# Patient Record
Sex: Female | Born: 1940 | Race: White | Hispanic: No | Smoking: Never smoker
Health system: Southern US, Community
[De-identification: ages and names within clinical notes are randomized; demographics above are authoritative.]

## PROBLEM LIST (undated history)

## (undated) DIAGNOSIS — E119 Type 2 diabetes mellitus without complications: Secondary | ICD-10-CM

## (undated) DIAGNOSIS — E039 Hypothyroidism, unspecified: Secondary | ICD-10-CM

## (undated) DIAGNOSIS — I4891 Unspecified atrial fibrillation: Secondary | ICD-10-CM

## (undated) DIAGNOSIS — I2510 Atherosclerotic heart disease of native coronary artery without angina pectoris: Secondary | ICD-10-CM

## (undated) DIAGNOSIS — I5020 Unspecified systolic (congestive) heart failure: Secondary | ICD-10-CM

## (undated) DIAGNOSIS — I10 Essential (primary) hypertension: Secondary | ICD-10-CM

## (undated) DIAGNOSIS — E785 Hyperlipidemia, unspecified: Secondary | ICD-10-CM

## (undated) DIAGNOSIS — I480 Paroxysmal atrial fibrillation: Secondary | ICD-10-CM

## (undated) HISTORY — DX: Unspecified systolic (congestive) heart failure: I50.20

## (undated) HISTORY — PX: COLONOSCOPY: SHX174

## (undated) HISTORY — PX: TONSILLECTOMY: SUR1361

---

## 1984-04-06 HISTORY — PX: ABDOMINAL HYSTERECTOMY: SHX81

## 2004-09-23 ENCOUNTER — Ambulatory Visit: Payer: Self-pay | Admitting: Internal Medicine

## 2005-05-20 ENCOUNTER — Ambulatory Visit: Payer: Self-pay | Admitting: Urology

## 2005-11-05 ENCOUNTER — Ambulatory Visit: Payer: Self-pay | Admitting: Internal Medicine

## 2006-08-06 ENCOUNTER — Emergency Department: Payer: Self-pay | Admitting: Internal Medicine

## 2006-08-06 ENCOUNTER — Other Ambulatory Visit: Payer: Self-pay

## 2006-11-26 ENCOUNTER — Ambulatory Visit: Payer: Self-pay | Admitting: Internal Medicine

## 2007-02-14 ENCOUNTER — Ambulatory Visit: Payer: Self-pay | Admitting: Physician Assistant

## 2007-02-17 ENCOUNTER — Ambulatory Visit: Payer: Self-pay | Admitting: Gastroenterology

## 2007-04-07 HISTORY — PX: HAMMER TOE SURGERY: SHX385

## 2008-01-05 ENCOUNTER — Ambulatory Visit: Payer: Self-pay | Admitting: Internal Medicine

## 2008-12-27 ENCOUNTER — Encounter: Admission: RE | Admit: 2008-12-27 | Discharge: 2008-12-27 | Payer: Self-pay | Admitting: Internal Medicine

## 2008-12-27 IMAGING — US US EXTREM LOW VENOUS*R*
1 series · 17 of 24 positions shown · non-contrast
Comparison: none

REASON FOR EXAM: Rt Leg Edema  Call Report [id number] Ext [phone]
COMMENTS:

[Series 1: us extrem low venous*right* · 17 of 33 slices shown]
[im 1/33]
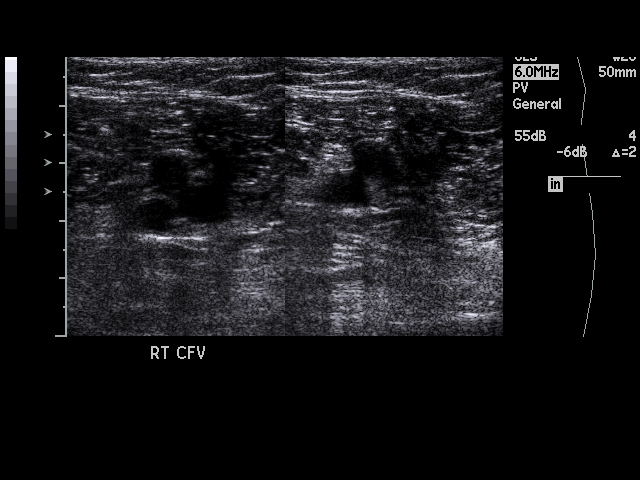
[im 3/33]
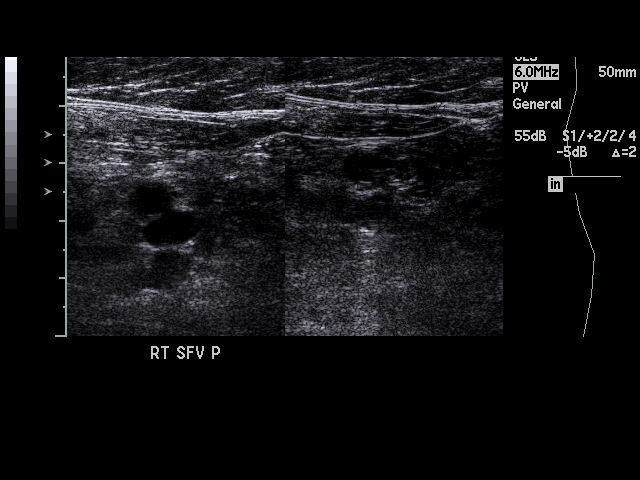
[im 5/33]
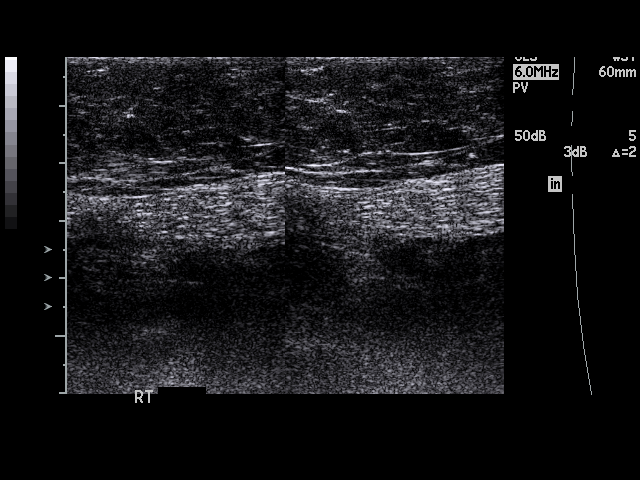
[im 6/33]
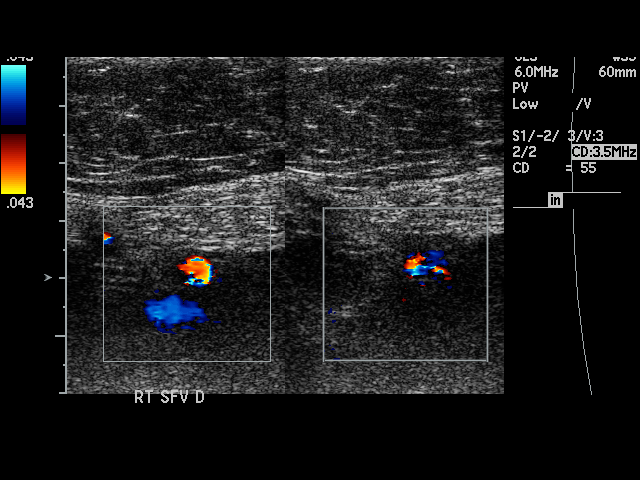
[im 9/33]
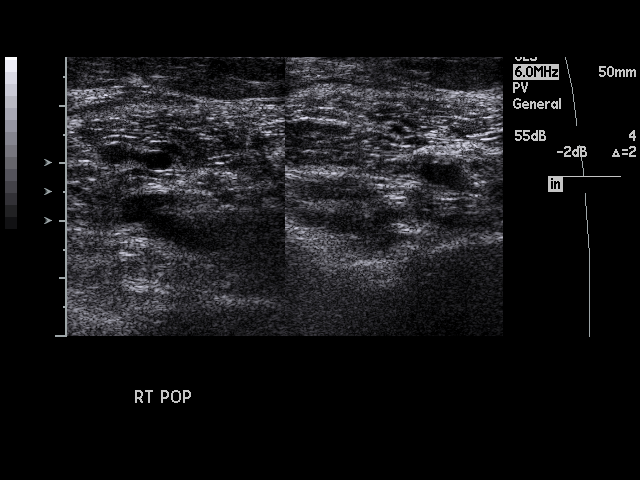
[im 10/33]
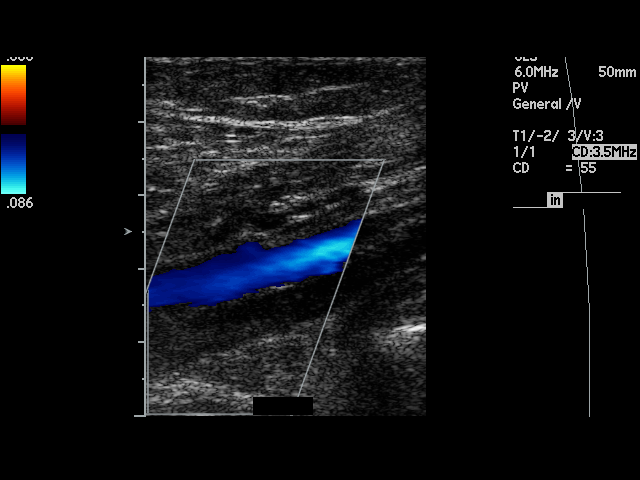
[im 13/33]
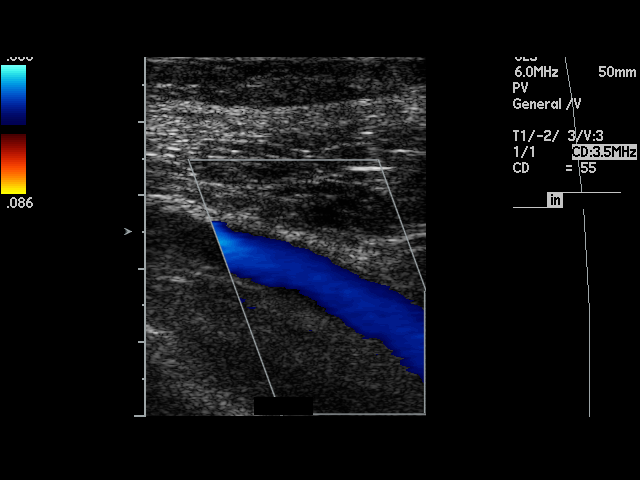
[im 14/33]
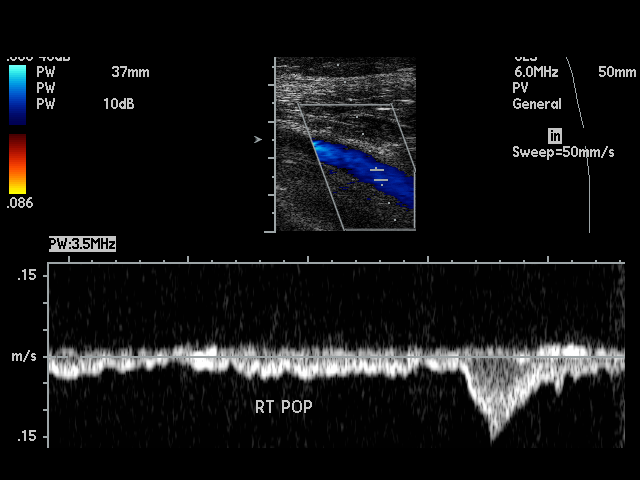
[im 17/33]
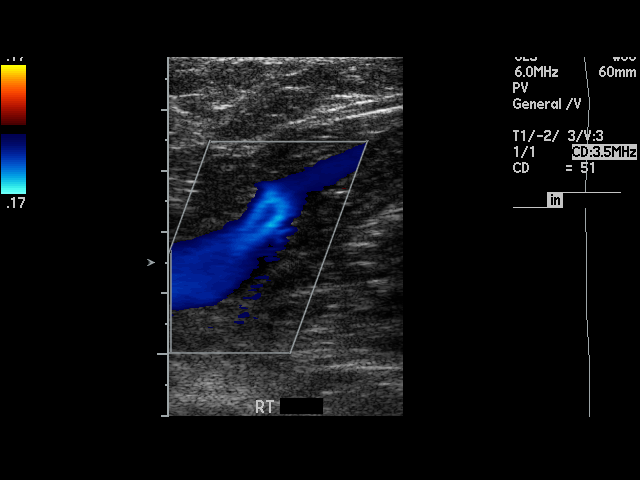
[im 19/33]
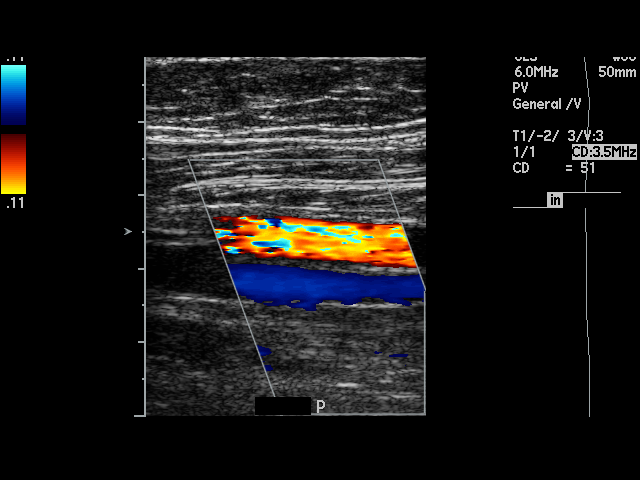
[im 20/33]
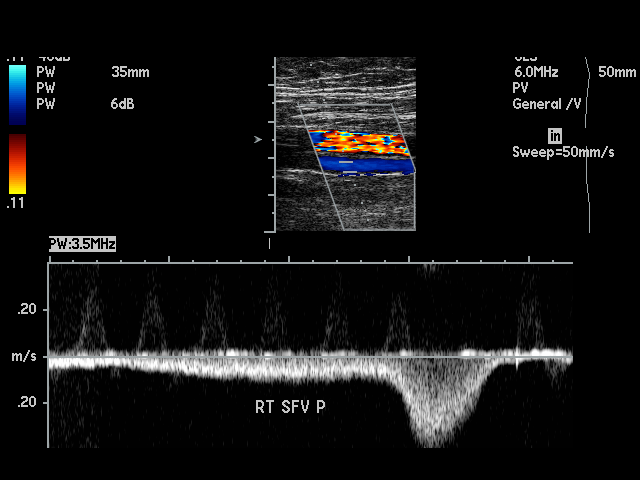
[im 23/33]
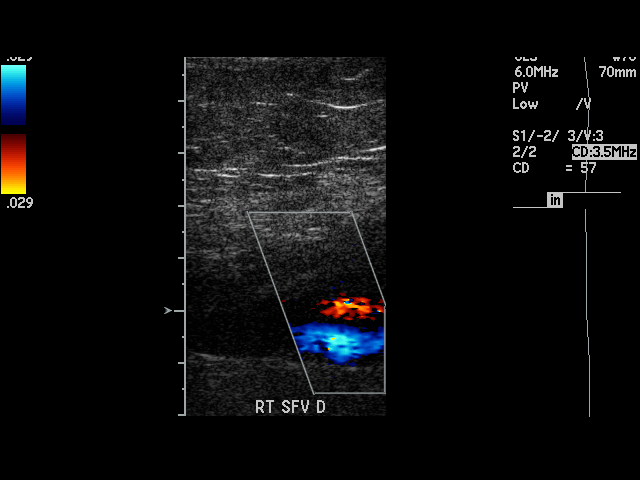
[im 24/33]
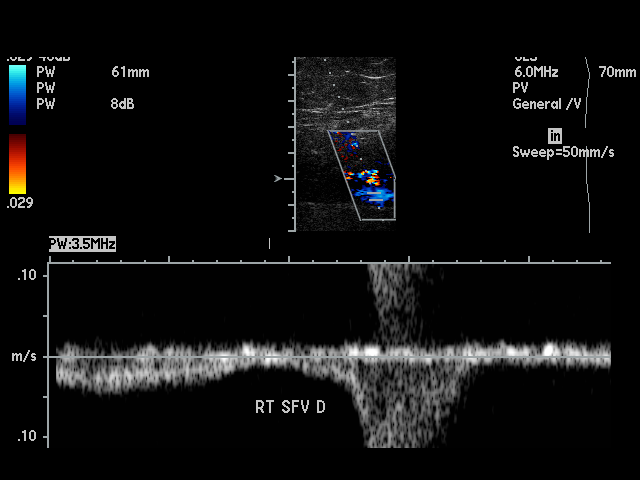
[im 27/33]
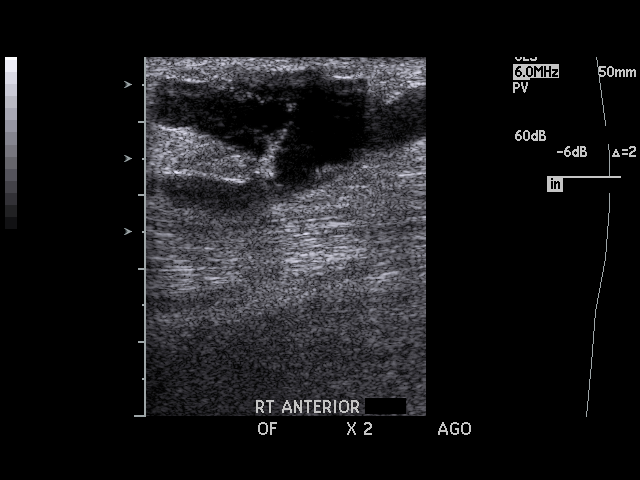
[im 28/33]
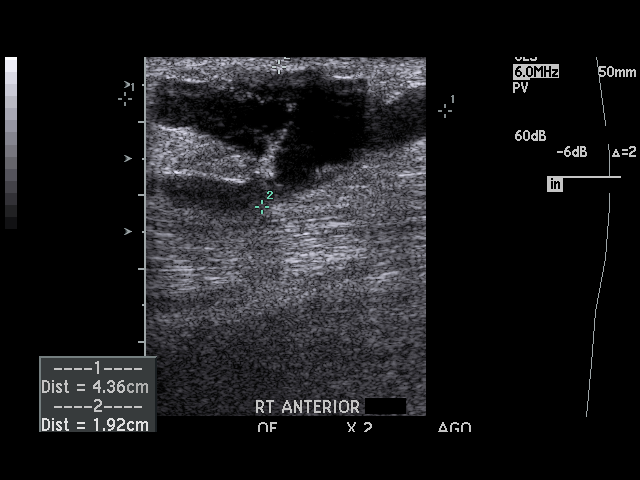
[im 30/33]
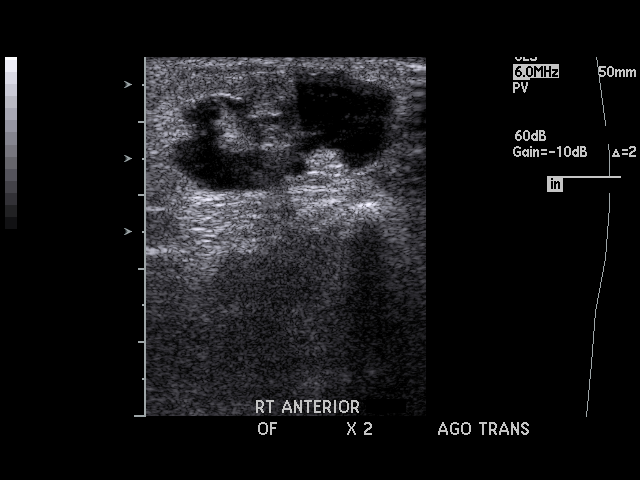
[im 33/33]
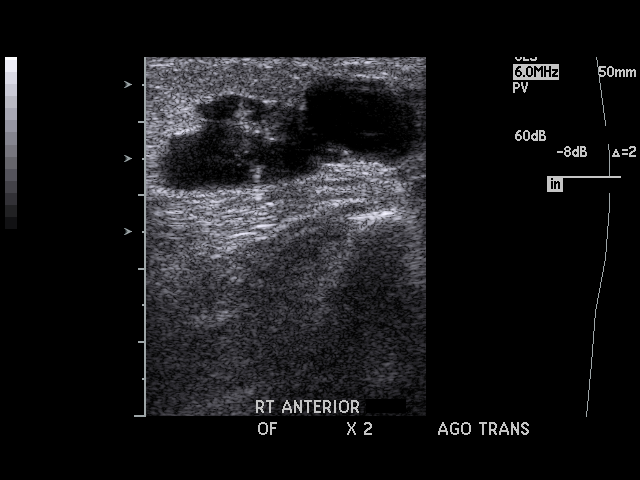

[17 of 24 positions shown; findings below may reference images not displayed]

PROCEDURE:     US  - US DOPPLER LOW EXTR RIGHT  - February 14, 2007  [DATE]

RESULT:     Duplex Doppler interrogation of the deep venous system of the
RIGHT leg is performed. The study demonstrates normal compressibility of the
deep venous system from the inguinal through the popliteal region. The veins
are fully compressible with a normal color and SPECRAL Doppler appearance.
There is increased flow with distal augmentation.

There is an irregularly marginated area of predominantly decreased
echogenicity in the anterior mid lower leg. This could represent a hematoma
given the history of trauma. An infectious process with an abscess could not
be completely excluded but given the irregular margins is felt to be less
likely. There may be septation present.
IMPRESSION: 1. No DVT.
2. Fluid collection anteriorly in the lower leg on the RIGHT which could
represent hematoma or developing abscess. Followup is recommended.

## 2009-01-08 ENCOUNTER — Ambulatory Visit: Payer: Self-pay | Admitting: Internal Medicine

## 2010-01-10 ENCOUNTER — Ambulatory Visit: Payer: Self-pay | Admitting: Internal Medicine

## 2011-01-14 ENCOUNTER — Ambulatory Visit: Payer: Self-pay | Admitting: Internal Medicine

## 2019-08-18 HISTORY — PX: CORONARY ANGIOPLASTY WITH STENT PLACEMENT: SHX49

## 2020-06-24 ENCOUNTER — Other Ambulatory Visit: Payer: Self-pay | Admitting: Infectious Diseases

## 2020-06-24 DIAGNOSIS — R7989 Other specified abnormal findings of blood chemistry: Secondary | ICD-10-CM

## 2020-07-23 ENCOUNTER — Other Ambulatory Visit: Payer: Self-pay

## 2020-07-23 ENCOUNTER — Ambulatory Visit
Admission: RE | Admit: 2020-07-23 | Discharge: 2020-07-23 | Disposition: A | Discharge status: Home / Self Care | Payer: Medicare Other | Source: Ambulatory Visit | Attending: Infectious Diseases | Admitting: Infectious Diseases

## 2020-07-23 DIAGNOSIS — R7989 Other specified abnormal findings of blood chemistry: Secondary | ICD-10-CM | POA: Insufficient documentation

## 2020-08-07 ENCOUNTER — Other Ambulatory Visit: Payer: Self-pay | Admitting: General Surgery

## 2020-08-07 DIAGNOSIS — K8020 Calculus of gallbladder without cholecystitis without obstruction: Secondary | ICD-10-CM

## 2020-08-07 DIAGNOSIS — K8021 Calculus of gallbladder without cholecystitis with obstruction: Secondary | ICD-10-CM

## 2020-08-07 DIAGNOSIS — R748 Abnormal levels of other serum enzymes: Secondary | ICD-10-CM

## 2020-08-24 ENCOUNTER — Ambulatory Visit
Admission: RE | Admit: 2020-08-24 | Discharge: 2020-08-24 | Disposition: A | Discharge status: Home / Self Care | Payer: Medicare Other | Source: Ambulatory Visit | Attending: General Surgery | Admitting: General Surgery

## 2020-08-24 DIAGNOSIS — K8020 Calculus of gallbladder without cholecystitis without obstruction: Secondary | ICD-10-CM

## 2020-08-24 DIAGNOSIS — R748 Abnormal levels of other serum enzymes: Secondary | ICD-10-CM

## 2020-08-24 DIAGNOSIS — K8021 Calculus of gallbladder without cholecystitis with obstruction: Secondary | ICD-10-CM

## 2020-08-24 MED ORDER — GADOBENATE DIMEGLUMINE 529 MG/ML IV SOLN
20.0000 mL | Freq: Once | INTRAVENOUS | Status: AC | PRN
  Administered 2020-08-24: 20 mL via INTRAVENOUS

## 2020-10-24 ENCOUNTER — Ambulatory Visit: Payer: Self-pay | Admitting: General Surgery

## 2020-10-24 NOTE — H&P (View-Only) (Signed)
PATIENT PROFILE: Sue Andrews is a 79 y.o. female who presents for evaluation of cholelithiasis.  PCP:  Fitzgerald, David Patrick, MD  HISTORY OF PRESENT ILLNESS: Sue Andrews reports feeling well.  She denies any significant abdominal pain.  There is no pain radiation.  There is no alleviating or aggravating factors.  Patient reported that she has been doing well since she was evaluated last time.  She has been seen before due to ultrasound finding of 3.5 cm gallbladder stone.  She reports having mild elevated alkaline phosphatase.  This led to MRCP.  MRCP confirmed a 3.5 cm gallbladder stone.  Denies any jaundice.  Denies any fever or chills.  Patient tolerating diet.  Patient denies any chest pain or shortness of breath.  She was evaluated recently by cardiology.  Cardiology cleared patient for surgery.   PROBLEM LIST: Problem List  Never Reviewed          Noted   S/P drug eluting coronary stent placement 06/10/2020   Overview    3.0 x 16 mm Synergy, 08/18/2019       Coronary artery disease involving native heart without angina pectoris 05/22/2020   Type 2 diabetes mellitus without complication, with long-term current use of insulin (CMS-HCC) 05/22/2020   HTN (hypertension), benign 05/22/2020   Hyperlipidemia 05/22/2020   Hypothyroidism 05/22/2020   History of atrial fibrillation 05/22/2020       GENERAL REVIEW OF SYSTEMS:   General ROS: negative for - chills, fatigue, fever, weight gain or weight loss Allergy and Immunology ROS: negative for - hives  Hematological and Lymphatic ROS: negative for - bleeding problems or bruising, negative for palpable nodes Endocrine ROS: negative for - heat or cold intolerance, hair changes Respiratory ROS: negative for - cough, shortness of breath or wheezing Cardiovascular ROS: no chest pain or palpitations GI ROS: negative for nausea, vomiting, abdominal pain, diarrhea, constipation Musculoskeletal ROS: negative for - joint swelling or  muscle pain Neurological ROS: negative for - confusion, syncope Dermatological ROS: negative for pruritus and rash Psychiatric: negative for anxiety, depression, difficulty sleeping and memory loss  MEDICATIONS: Current Outpatient Medications  Medication Sig Dispense Refill   apixaban (ELIQUIS) 5 mg tablet Take 1 tablet (5 mg total) by mouth 2 (two) times daily 60 tablet 11   aspirin 81 MG EC tablet Take 1 tablet (81 mg total) by mouth once daily 30 tablet 11   cholecalciferol (VITAMIN D3) 1000 unit tablet Take by mouth     cyanocobalamin (VITAMIN B12) 1000 MCG tablet Take 1,000 mcg by mouth once daily     FUROsemide (LASIX) 20 MG tablet Take 1 tablet (20 mg total) by mouth once daily 90 tablet 2   HUMALOG MIX 75-25 KWIKPEN 100 unit/mL (75-25) pen injector Inject 40 Units subcutaneously 2 (two) times daily 45 mL 3   levothyroxine (SYNTHROID) 25 MCG tablet Take 1 tablet (25 mcg total) by mouth once daily Take on an empty stomach with a glass of water at least 30-60 minutes before breakfast. 90 tablet 2   losartan (COZAAR) 25 MG tablet Take 1 tablet (25 mg total) by mouth once daily 90 tablet 3   metoprolol tartrate (LOPRESSOR) 50 MG tablet Take 1 tablet (50 mg total) by mouth 2 (two) times daily 60 tablet 11   montelukast (SINGULAIR) 10 mg tablet Take 1 tablet (10 mg total) by mouth nightly 90 tablet 3   multivit-min/iron/folic/lutein (CENTRUM SILVER WOMEN ORAL) Take by mouth     atorvastatin (LIPITOR) 40 MG tablet Take 1   tablet (40 mg total) by mouth once daily (Patient not taking: No sig reported) 90 tablet 3   No current facility-administered medications for this visit.    ALLERGIES: Patient has no known allergies.  PAST MEDICAL HISTORY: Past Medical History:  Diagnosis Date   Diabetes mellitus without complication (CMS-HCC)    Hyperlipidemia    Hypertension    Thyroid disease     PAST SURGICAL HISTORY: Past Surgical History:  Procedure Laterality Date   Cardiac Stent  Placement     HYSTERECTOMY       FAMILY HISTORY: Family History  Problem Relation Age of Onset   Cancer Mother    Diabetes Father      SOCIAL HISTORY: Social History   Socioeconomic History   Marital status: Widowed  Tobacco Use   Smoking status: Never Smoker   Smokeless tobacco: Never Used  Vaping Use   Vaping Use: Never used  Substance and Sexual Activity   Alcohol use: Not Currently    PHYSICAL EXAM: Vitals:   10/24/20 1130  BP: 126/79  Pulse: 79   Body mass index is 37.72 kg/m. Weight: 96.6 kg (212 lb 15.4 oz)   GENERAL: Alert, active, oriented x3  HEENT: Pupils equal reactive to light. Extraocular movements are intact. Sclera clear. Palpebral conjunctiva normal red color.Pharynx clear.  NECK: Supple with no palpable mass and no adenopathy.  LUNGS: Sound clear with no rales rhonchi or wheezes.  HEART: Regular rhythm S1 and S2 without murmur.  ABDOMEN: Soft and depressible, nontender with no palpable mass, no hepatomegaly.   EXTREMITIES: Well-developed well-nourished symmetrical with no dependent edema.  NEUROLOGICAL: Awake alert oriented, facial expression symmetrical, moving all extremities.  REVIEW OF DATA: I have reviewed the following data today: Office Visit on 09/30/2020  Component Date Value   Vent Rate (bpm) 09/30/2020 113    QRS Interval (msec) 09/30/2020 96    QT Interval (msec) 09/30/2020 326    QTc (msec) 09/30/2020 447   Ancillary Orders on 09/11/2020  Component Date Value   Hemoglobin A1C 09/11/2020 6.5 (!)   Average Blood Glucose (C* 09/11/2020 140    Glucose 09/11/2020 182 (!)   Sodium 09/11/2020 139    Potassium 09/11/2020 4.7    Chloride 09/11/2020 104    Carbon Dioxide (CO2) 09/11/2020 30.7    Urea Nitrogen (BUN) 09/11/2020 20    Creatinine 09/11/2020 0.7    Glomerular Filtration Ra* 09/11/2020 81    Calcium 09/11/2020 9.4    AST  09/11/2020 31    ALT  09/11/2020 29    Alk Phos (alkaline Phosp* 09/11/2020 111 (!)    Albumin 09/11/2020 3.8    Bilirubin, Total 09/11/2020 1.0    Protein, Total 09/11/2020 6.4    A/G Ratio 09/11/2020 1.5    Bilirubin, Conjugated 09/11/2020 0.22 (!)   Cholesterol, Total 09/11/2020 189    Triglyceride 09/11/2020 100    HDL (High Density Lipopr* 09/11/2020 63.6    LDL Calculated 09/11/2020 105    VLDL Cholesterol 09/11/2020 20    Cholesterol/HDL Ratio 09/11/2020 3.0   Ancillary Orders on 07/30/2020  Component Date Value   Protein, Total 07/30/2020 6.0 (!)   Albumin 07/30/2020 3.7    Bilirubin, Total 07/30/2020 1.0    Bilirubin, Conjugated 07/30/2020 0.26 (!)   Alk Phos (alkaline Phosp* 07/30/2020 170 (!)   AST  07/30/2020 46 (!)   ALT  07/30/2020 54 (!)     ASSESSMENT: Sue Andrews is a 79 y.o. female presenting for consultation for cholelithiasis.      Patient was oriented about the diagnosis of cholelithiasis. Also oriented about what is the gallbladder, its anatomy and function and the implications of having stones / gallbladder low ejection fraction. The patient was oriented about the treatment alternatives (observation vs cholecystectomy). Patient was oriented that a low percentage of patient will continue to have similar pain symptoms even after the gallbladder is removed. Surgical technique (open vs laparoscopic) was discussed. It was also discussed the goals of the surgery (decrease the pain episodes and avoid the risk of cholecystitis) and the risk of surgery including: bleeding, infection, common bile duct injury, stone retention, injury to other organs such as bowel, liver, stomach, other complications such as hernia, bowel obstruction among others. Also discussed with patient about anesthesia and its complications such as: reaction to medications, pneumonia, heart complications, death, among others.   Patient oriented that the indication for surgery is due to the size of the stone more than 3 cm with increased risk of gallbladder cancer.  Patient understand and she  agreed to proceed.  Cholelithiasis without cholecystitis [K80.20]  PLAN: 1.  Robotic assisted laparoscopic cholecystectomy (47562) 2.  Cardiology clearance (Dr. Paraschos) 3. Will need to hold aspirin for 48 hours before surgery 4.  Do not take aspirin 5 days before the procedure 5.  Contact us if has any question or concern.   Patient verbalized understanding, all questions were answered, and were agreeable with the plan outlined above.     Aleisha Paone Cintron-Diaz, MD  Electronically signed by Buford Gayler Cintron-Diaz, MD  

## 2020-10-24 NOTE — H&P (Signed)
PATIENT PROFILE: Sue Andrews is a 80 y.o. female who presents for evaluation of cholelithiasis.  PCP:  Angelena Form, MD  HISTORY OF PRESENT ILLNESS: Ms. Nonaka reports feeling well.  She denies any significant abdominal pain.  There is no pain radiation.  There is no alleviating or aggravating factors.  Patient reported that she has been doing well since she was evaluated last time.  She has been seen before due to ultrasound finding of 3.5 cm gallbladder stone.  She reports having mild elevated alkaline phosphatase.  This led to MRCP.  MRCP confirmed a 3.5 cm gallbladder stone.  Denies any jaundice.  Denies any fever or chills.  Patient tolerating diet.  Patient denies any chest pain or shortness of breath.  She was evaluated recently by cardiology.  Cardiology cleared patient for surgery.   PROBLEM LIST: Problem List  Never Reviewed          Noted   S/P drug eluting coronary stent placement 06/10/2020   Overview    3.0 x 16 mm Synergy, 08/18/2019       Coronary artery disease involving native heart without angina pectoris 05/22/2020   Type 2 diabetes mellitus without complication, with long-term current use of insulin (CMS-HCC) 05/22/2020   HTN (hypertension), benign 05/22/2020   Hyperlipidemia 05/22/2020   Hypothyroidism 05/22/2020   History of atrial fibrillation 05/22/2020       GENERAL REVIEW OF SYSTEMS:   General ROS: negative for - chills, fatigue, fever, weight gain or weight loss Allergy and Immunology ROS: negative for - hives  Hematological and Lymphatic ROS: negative for - bleeding problems or bruising, negative for palpable nodes Endocrine ROS: negative for - heat or cold intolerance, hair changes Respiratory ROS: negative for - cough, shortness of breath or wheezing Cardiovascular ROS: no chest pain or palpitations GI ROS: negative for nausea, vomiting, abdominal pain, diarrhea, constipation Musculoskeletal ROS: negative for - joint swelling or  muscle pain Neurological ROS: negative for - confusion, syncope Dermatological ROS: negative for pruritus and rash Psychiatric: negative for anxiety, depression, difficulty sleeping and memory loss  MEDICATIONS: Current Outpatient Medications  Medication Sig Dispense Refill   apixaban (ELIQUIS) 5 mg tablet Take 1 tablet (5 mg total) by mouth 2 (two) times daily 60 tablet 11   aspirin 81 MG EC tablet Take 1 tablet (81 mg total) by mouth once daily 30 tablet 11   cholecalciferol (VITAMIN D3) 1000 unit tablet Take by mouth     cyanocobalamin (VITAMIN B12) 1000 MCG tablet Take 1,000 mcg by mouth once daily     FUROsemide (LASIX) 20 MG tablet Take 1 tablet (20 mg total) by mouth once daily 90 tablet 2   HUMALOG MIX 75-25 KWIKPEN 100 unit/mL (75-25) pen injector Inject 40 Units subcutaneously 2 (two) times daily 45 mL 3   levothyroxine (SYNTHROID) 25 MCG tablet Take 1 tablet (25 mcg total) by mouth once daily Take on an empty stomach with a glass of water at least 30-60 minutes before breakfast. 90 tablet 2   losartan (COZAAR) 25 MG tablet Take 1 tablet (25 mg total) by mouth once daily 90 tablet 3   metoprolol tartrate (LOPRESSOR) 50 MG tablet Take 1 tablet (50 mg total) by mouth 2 (two) times daily 60 tablet 11   montelukast (SINGULAIR) 10 mg tablet Take 1 tablet (10 mg total) by mouth nightly 90 tablet 3   multivit-min/iron/folic/lutein (CENTRUM SILVER WOMEN ORAL) Take by mouth     atorvastatin (LIPITOR) 40 MG tablet Take 1  tablet (40 mg total) by mouth once daily (Patient not taking: No sig reported) 90 tablet 3   No current facility-administered medications for this visit.    ALLERGIES: Patient has no known allergies.  PAST MEDICAL HISTORY: Past Medical History:  Diagnosis Date   Diabetes mellitus without complication (CMS-HCC)    Hyperlipidemia    Hypertension    Thyroid disease     PAST SURGICAL HISTORY: Past Surgical History:  Procedure Laterality Date   Cardiac Stent  Placement     HYSTERECTOMY       FAMILY HISTORY: Family History  Problem Relation Age of Onset   Cancer Mother    Diabetes Father      SOCIAL HISTORY: Social History   Socioeconomic History   Marital status: Widowed  Tobacco Use   Smoking status: Never Smoker   Smokeless tobacco: Never Used  Vaping Use   Vaping Use: Never used  Substance and Sexual Activity   Alcohol use: Not Currently    PHYSICAL EXAM: Vitals:   10/24/20 1130  BP: 126/79  Pulse: 79   Body mass index is 37.72 kg/m. Weight: 96.6 kg (212 lb 15.4 oz)   GENERAL: Alert, active, oriented x3  HEENT: Pupils equal reactive to light. Extraocular movements are intact. Sclera clear. Palpebral conjunctiva normal red color.Pharynx clear.  NECK: Supple with no palpable mass and no adenopathy.  LUNGS: Sound clear with no rales rhonchi or wheezes.  HEART: Regular rhythm S1 and S2 without murmur.  ABDOMEN: Soft and depressible, nontender with no palpable mass, no hepatomegaly.   EXTREMITIES: Well-developed well-nourished symmetrical with no dependent edema.  NEUROLOGICAL: Awake alert oriented, facial expression symmetrical, moving all extremities.  REVIEW OF DATA: I have reviewed the following data today: Office Visit on 09/30/2020  Component Date Value   Vent Rate (bpm) 09/30/2020 113    QRS Interval (msec) 09/30/2020 96    QT Interval (msec) 09/30/2020 326    QTc (msec) 09/30/2020 447   Ancillary Orders on 09/11/2020  Component Date Value   Hemoglobin A1C 09/11/2020 6.5 (!)   Average Blood Glucose (C* 09/11/2020 140    Glucose 09/11/2020 182 (!)   Sodium 09/11/2020 139    Potassium 09/11/2020 4.7    Chloride 09/11/2020 104    Carbon Dioxide (CO2) 09/11/2020 30.7    Urea Nitrogen (BUN) 09/11/2020 20    Creatinine 09/11/2020 0.7    Glomerular Filtration Ra* 09/11/2020 81    Calcium 09/11/2020 9.4    AST  09/11/2020 31    ALT  09/11/2020 29    Alk Phos (alkaline Phosp* 09/11/2020 111 (!)    Albumin 09/11/2020 3.8    Bilirubin, Total 09/11/2020 1.0    Protein, Total 09/11/2020 6.4    A/G Ratio 09/11/2020 1.5    Bilirubin, Conjugated 09/11/2020 0.22 (!)   Cholesterol, Total 09/11/2020 189    Triglyceride 09/11/2020 100    HDL (High Density Lipopr* 09/11/2020 63.6    LDL Calculated 09/11/2020 105    VLDL Cholesterol 09/11/2020 20    Cholesterol/HDL Ratio 09/11/2020 3.0   Ancillary Orders on 07/30/2020  Component Date Value   Protein, Total 07/30/2020 6.0 (!)   Albumin 07/30/2020 3.7    Bilirubin, Total 07/30/2020 1.0    Bilirubin, Conjugated 07/30/2020 0.26 (!)   Alk Phos (alkaline Phosp* 07/30/2020 170 (!)   AST  07/30/2020 46 (!)   ALT  07/30/2020 54 (!)     ASSESSMENT: Ms. Cousar is a 80 y.o. female presenting for consultation for cholelithiasis.  Patient was oriented about the diagnosis of cholelithiasis. Also oriented about what is the gallbladder, its anatomy and function and the implications of having stones / gallbladder low ejection fraction. The patient was oriented about the treatment alternatives (observation vs cholecystectomy). Patient was oriented that a low percentage of patient will continue to have similar pain symptoms even after the gallbladder is removed. Surgical technique (open vs laparoscopic) was discussed. It was also discussed the goals of the surgery (decrease the pain episodes and avoid the risk of cholecystitis) and the risk of surgery including: bleeding, infection, common bile duct injury, stone retention, injury to other organs such as bowel, liver, stomach, other complications such as hernia, bowel obstruction among others. Also discussed with patient about anesthesia and its complications such as: reaction to medications, pneumonia, heart complications, death, among others.   Patient oriented that the indication for surgery is due to the size of the stone more than 3 cm with increased risk of gallbladder cancer.  Patient understand and she  agreed to proceed.  Cholelithiasis without cholecystitis [K80.20]  PLAN: 1.  Robotic assisted laparoscopic cholecystectomy (59733) 2.  Cardiology clearance (Dr. Saralyn Pilar) 3. Will need to hold aspirin for 48 hours before surgery 4.  Do not take aspirin 5 days before the procedure 5.  Contact us if has any question or concern.   Patient verbalized understanding, all questions were answered, and were agreeable with the plan outlined above.     Herbert Pun, MD  Electronically signed by Herbert Pun, MD

## 2020-11-04 ENCOUNTER — Encounter
Admission: RE | Admit: 2020-11-04 | Discharge: 2020-11-04 | Disposition: A | Discharge status: Home / Self Care | Payer: Medicare Other | Source: Ambulatory Visit | Attending: General Surgery | Admitting: General Surgery

## 2020-11-04 ENCOUNTER — Other Ambulatory Visit: Payer: Self-pay

## 2020-11-04 HISTORY — DX: Hyperlipidemia, unspecified: E78.5

## 2020-11-04 HISTORY — DX: Hypothyroidism, unspecified: E03.9

## 2020-11-04 HISTORY — DX: Unspecified atrial fibrillation: I48.91

## 2020-11-04 HISTORY — DX: Atherosclerotic heart disease of native coronary artery without angina pectoris: I25.10

## 2020-11-04 HISTORY — DX: Type 2 diabetes mellitus without complications: E11.9

## 2020-11-04 HISTORY — DX: Essential (primary) hypertension: I10

## 2020-11-04 NOTE — Patient Instructions (Addendum)
Your procedure is scheduled on: Wednesday, August 10 Report to the Registration Desk on the 1st floor of the [facility] To find out your arrival time, please call [phone] between 1PM - 3PM on: Tuesday, August 9  REMEMBER: Instructions that are not followed completely may result in serious medical risk, up to and including death; or upon the discretion of your surgeon and anesthesiologist your surgery may need to be rescheduled.  Do not eat food after midnight the night before surgery.  No gum chewing, lozengers or hard candies.  You may however, drink water up to 2 hours before you are scheduled to arrive for your surgery. Do not drink anything within 2 hours of your scheduled arrival time.  TAKE THESE MEDICATIONS THE MORNING OF SURGERY WITH A SIP OF WATER:  Levothyroxine Metoprolol  Do NOT take ANY INSULIN on the morning of surgery.  Follow recommendations from Cardiologist regarding stopping Eliquis. According to Dr. Darrold Junker note, stop Eliquis 2 days prior to procedure. Last day to take Eliquis is Sunday, August 7. Resume AFTER surgery per surgeon instruction. Per Dr. Hazle Quant, stop aspirin 5 days prior to surgery. Last day to take ASPIRIN is Thursday, August 4.  One week prior to surgery: starting August 3 Stop Anti-inflammatories (NSAIDS) such as Advil, Aleve, Ibuprofen, Motrin, Naproxen, Naprosyn and Aspirin based products such as Excedrin, Goodys Powder, BC Powder. Stop ANY OVER THE COUNTER supplements until after surgery. You may however, continue to take Tylenol if needed for pain up until the day of surgery.  On the morning of surgery brush your teeth with toothpaste and water, you may rinse your mouth with mouthwash if you wish. Do not swallow any toothpaste or mouthwash.  Do not wear jewelry, make-up, hairpins, clips or nail polish.  Do not wear lotions, powders, or perfumes.   Do not shave body from the neck down 48 hours prior to surgery just in case you  cut yourself which could leave a site for infection.  Also, freshly shaved skin may become irritated if using the CHG soap.  Contact lenses, hearing aids and dentures may not be worn into surgery.  Do not bring valuables to the hospital. [facility] is not responsible for any missing/lost belongings or valuables.   Use CHG Soap as directed on instruction sheet.  Notify your doctor if there is any change in your medical condition (cold, fever, infection).  Wear comfortable clothing (specific to your surgery type) to the hospital.  After surgery, you can help prevent lung complications by doing breathing exercises.  Take deep breaths and cough every 1-2 hours. Your doctor may order a device called an Incentive Spirometer to help you take deep breaths. When coughing or sneezing, hold a pillow firmly against your incision with both hands. This is called "splinting." Doing this helps protect your incision. It also decreases belly discomfort.  If you are being discharged the day of surgery, you will not be allowed to drive home. You will need a responsible adult (18 years or older) to drive you home and stay with you that night.   If you are taking public transportation, you will need to have a responsible adult (18 years or older) with you. Please confirm with your physician that it is acceptable to use public transportation.   Please call the Pre-admissions Testing Dept. at [phone] if you have any questions about these instructions.  Surgery Visitation Policy:  Patients undergoing a surgery or procedure may have one family member or support  person with them as long as that person is not COVID-19 positive or experiencing its symptoms.  That person may remain in the waiting area during the procedure.

## 2020-11-05 ENCOUNTER — Encounter: Payer: Self-pay | Admitting: Urgent Care

## 2020-11-05 ENCOUNTER — Encounter
Admission: RE | Admit: 2020-11-05 | Discharge: 2020-11-05 | Disposition: A | Discharge status: Home / Self Care | Payer: Medicare Other | Source: Ambulatory Visit | Attending: General Surgery | Admitting: General Surgery

## 2020-11-05 DIAGNOSIS — Z01812 Encounter for preprocedural laboratory examination: Secondary | ICD-10-CM | POA: Diagnosis present

## 2020-11-05 LAB — CBC
HCT: 41.6 % (ref 36.0–46.0)
Hemoglobin: 14 g/dL (ref 12.0–15.0)
MCH: 32.6 pg (ref 26.0–34.0)
MCHC: 33.7 g/dL (ref 30.0–36.0)
MCV: 96.7 fL (ref 80.0–100.0)
Platelets: 182 10*3/uL (ref 150–400)
RBC: 4.3 MIL/uL (ref 3.87–5.11)
RDW: 14.4 % (ref 11.5–15.5)
WBC: 4 10*3/uL (ref 4.0–10.5)
nRBC: 0 % (ref 0.0–0.2)

## 2020-11-08 ENCOUNTER — Encounter: Payer: Self-pay | Admitting: General Surgery

## 2020-11-13 ENCOUNTER — Encounter
Admission: RE | Disposition: A | Discharge status: Home / Self Care | Payer: Self-pay | Source: Home / Self Care | Attending: General Surgery

## 2020-11-13 ENCOUNTER — Ambulatory Visit: Payer: Medicare Other | Admitting: Urgent Care

## 2020-11-13 ENCOUNTER — Ambulatory Visit
Admission: RE | Admit: 2020-11-13 | Discharge: 2020-11-13 | Disposition: A | Discharge status: Home / Self Care | Payer: Medicare Other | Attending: General Surgery | Admitting: General Surgery

## 2020-11-13 ENCOUNTER — Encounter: Payer: Self-pay | Admitting: General Surgery

## 2020-11-13 DIAGNOSIS — I2510 Atherosclerotic heart disease of native coronary artery without angina pectoris: Secondary | ICD-10-CM | POA: Diagnosis not present

## 2020-11-13 DIAGNOSIS — Z955 Presence of coronary angioplasty implant and graft: Secondary | ICD-10-CM | POA: Diagnosis not present

## 2020-11-13 DIAGNOSIS — Z7982 Long term (current) use of aspirin: Secondary | ICD-10-CM | POA: Diagnosis not present

## 2020-11-13 DIAGNOSIS — E119 Type 2 diabetes mellitus without complications: Secondary | ICD-10-CM | POA: Insufficient documentation

## 2020-11-13 DIAGNOSIS — I10 Essential (primary) hypertension: Secondary | ICD-10-CM | POA: Diagnosis not present

## 2020-11-13 DIAGNOSIS — I4891 Unspecified atrial fibrillation: Secondary | ICD-10-CM | POA: Diagnosis not present

## 2020-11-13 DIAGNOSIS — E785 Hyperlipidemia, unspecified: Secondary | ICD-10-CM | POA: Insufficient documentation

## 2020-11-13 DIAGNOSIS — E039 Hypothyroidism, unspecified: Secondary | ICD-10-CM | POA: Diagnosis not present

## 2020-11-13 DIAGNOSIS — Z7901 Long term (current) use of anticoagulants: Secondary | ICD-10-CM | POA: Insufficient documentation

## 2020-11-13 DIAGNOSIS — Z79890 Hormone replacement therapy (postmenopausal): Secondary | ICD-10-CM | POA: Diagnosis present

## 2020-11-13 DIAGNOSIS — Z794 Long term (current) use of insulin: Secondary | ICD-10-CM | POA: Insufficient documentation

## 2020-11-13 DIAGNOSIS — K8010 Calculus of gallbladder with chronic cholecystitis without obstruction: Secondary | ICD-10-CM | POA: Diagnosis not present

## 2020-11-13 DIAGNOSIS — Z79899 Other long term (current) drug therapy: Secondary | ICD-10-CM | POA: Diagnosis not present

## 2020-11-13 DIAGNOSIS — K8020 Calculus of gallbladder without cholecystitis without obstruction: Secondary | ICD-10-CM | POA: Diagnosis present

## 2020-11-13 HISTORY — DX: Type 2 diabetes mellitus without complications: E11.9

## 2020-11-13 HISTORY — DX: Paroxysmal atrial fibrillation: I48.0

## 2020-11-13 LAB — GLUCOSE, CAPILLARY
Glucose-Capillary: 135 mg/dL — ABNORMAL HIGH (ref 70–99)
Glucose-Capillary: 220 mg/dL — ABNORMAL HIGH (ref 70–99)

## 2020-11-13 SURGERY — CHOLECYSTECTOMY, ROBOT-ASSISTED, LAPAROSCOPIC
Anesthesia: General | Site: Abdomen

## 2020-11-13 MED ORDER — CEFAZOLIN SODIUM-DEXTROSE 2-4 GM/100ML-% IV SOLN
2.0000 g | INTRAVENOUS | Status: AC
  Administered 2020-11-13: 2 g via INTRAVENOUS

## 2020-11-13 MED ORDER — OXYCODONE HCL 5 MG PO TABS: 5.0000 mg | ORAL_TABLET | Freq: Once | ORAL | Status: AC | PRN

## 2020-11-13 MED ORDER — DEXAMETHASONE SODIUM PHOSPHATE 10 MG/ML IJ SOLN
INTRAMUSCULAR | Status: AC
  Filled 2020-11-13: qty 1

## 2020-11-13 MED ORDER — LIDOCAINE HCL (PF) 2 % IJ SOLN
INTRAMUSCULAR | Status: AC
  Filled 2020-11-13: qty 5

## 2020-11-13 MED ORDER — FAMOTIDINE 20 MG PO TABS
ORAL_TABLET | ORAL | Status: AC
  Administered 2020-11-13: 20 mg via ORAL
  Filled 2020-11-13: qty 1

## 2020-11-13 MED ORDER — FENTANYL CITRATE (PF) 100 MCG/2ML IJ SOLN
INTRAMUSCULAR | Status: AC
  Filled 2020-11-13: qty 2

## 2020-11-13 MED ORDER — BUPIVACAINE-EPINEPHRINE (PF) 0.25% -1:200000 IJ SOLN
INTRAMUSCULAR | Status: AC
  Filled 2020-11-13: qty 30

## 2020-11-13 MED ORDER — DEXAMETHASONE SODIUM PHOSPHATE 10 MG/ML IJ SOLN
INTRAMUSCULAR | Status: DC | PRN
  Administered 2020-11-13: 5 mg via INTRAVENOUS

## 2020-11-13 MED ORDER — FENTANYL CITRATE (PF) 100 MCG/2ML IJ SOLN
INTRAMUSCULAR | Status: AC
  Administered 2020-11-13: 25 ug via INTRAVENOUS
  Filled 2020-11-13: qty 2

## 2020-11-13 MED ORDER — HYDROCODONE-ACETAMINOPHEN 5-325 MG PO TABS: 1.0000 | ORAL_TABLET | ORAL | 0 refills | Status: AC | PRN

## 2020-11-13 MED ORDER — OXYCODONE HCL 5 MG/5ML PO SOLN: 5.0000 mg | Freq: Once | ORAL | Status: AC | PRN

## 2020-11-13 MED ORDER — ROCURONIUM BROMIDE 10 MG/ML (PF) SYRINGE
PREFILLED_SYRINGE | INTRAVENOUS | Status: AC
  Filled 2020-11-13: qty 10

## 2020-11-13 MED ORDER — ONDANSETRON HCL 4 MG/2ML IJ SOLN
INTRAMUSCULAR | Status: DC | PRN
  Administered 2020-11-13: 4 mg via INTRAVENOUS

## 2020-11-13 MED ORDER — LIDOCAINE HCL (CARDIAC) PF 100 MG/5ML IV SOSY
PREFILLED_SYRINGE | INTRAVENOUS | Status: DC | PRN
  Administered 2020-11-13: 80 mg via INTRAVENOUS

## 2020-11-13 MED ORDER — BUPIVACAINE-EPINEPHRINE 0.25% -1:200000 IJ SOLN
INTRAMUSCULAR | Status: DC | PRN
  Administered 2020-11-13: 30 mL

## 2020-11-13 MED ORDER — METOPROLOL TARTRATE 5 MG/5ML IV SOLN
INTRAVENOUS | Status: DC | PRN
  Administered 2020-11-13: 2 mg via INTRAVENOUS
  Administered 2020-11-13: 1 mg via INTRAVENOUS
  Administered 2020-11-13: 2 mg via INTRAVENOUS

## 2020-11-13 MED ORDER — FENTANYL CITRATE (PF) 100 MCG/2ML IJ SOLN
25.0000 ug | INTRAMUSCULAR | Status: DC | PRN
  Administered 2020-11-13 (×3): 25 ug via INTRAVENOUS

## 2020-11-13 MED ORDER — INDOCYANINE GREEN 25 MG IV SOLR
1.2500 mg | Freq: Once | INTRAVENOUS | Status: AC
  Administered 2020-11-13: 1.25 mg via INTRAVENOUS
  Filled 2020-11-13: qty 0.5

## 2020-11-13 MED ORDER — SODIUM CHLORIDE 0.9 % IV SOLN: INTRAVENOUS | Status: DC

## 2020-11-13 MED ORDER — SUGAMMADEX SODIUM 200 MG/2ML IV SOLN
INTRAVENOUS | Status: DC | PRN
Start: 2020-11-13 — End: 2020-11-13
  Administered 2020-11-13: 200 mg via INTRAVENOUS

## 2020-11-13 MED ORDER — FAMOTIDINE 20 MG PO TABS: 20.0000 mg | ORAL_TABLET | Freq: Once | ORAL | Status: AC

## 2020-11-13 MED ORDER — OXYCODONE HCL 5 MG PO TABS
ORAL_TABLET | ORAL | Status: AC
  Administered 2020-11-13: 5 mg via ORAL
  Filled 2020-11-13: qty 1

## 2020-11-13 MED ORDER — CEFAZOLIN SODIUM-DEXTROSE 2-4 GM/100ML-% IV SOLN
INTRAVENOUS | Status: AC
  Filled 2020-11-13: qty 100

## 2020-11-13 MED ORDER — FENTANYL CITRATE (PF) 100 MCG/2ML IJ SOLN
INTRAMUSCULAR | Status: DC | PRN
  Administered 2020-11-13: 50 ug via INTRAVENOUS

## 2020-11-13 MED ORDER — ROCURONIUM BROMIDE 100 MG/10ML IV SOLN
INTRAVENOUS | Status: DC | PRN
Start: 2020-11-13 — End: 2020-11-13
  Administered 2020-11-13: 60 mg via INTRAVENOUS

## 2020-11-13 MED ORDER — CHLORHEXIDINE GLUCONATE 0.12 % MT SOLN
OROMUCOSAL | Status: AC
  Administered 2020-11-13: 15 mL via OROMUCOSAL
  Filled 2020-11-13: qty 15

## 2020-11-13 MED ORDER — PROPOFOL 10 MG/ML IV BOLUS
INTRAVENOUS | Status: AC
  Filled 2020-11-13: qty 20

## 2020-11-13 MED ORDER — CHLORHEXIDINE GLUCONATE 0.12 % MT SOLN: 15.0000 mL | Freq: Once | OROMUCOSAL | Status: AC

## 2020-11-13 MED ORDER — ONDANSETRON HCL 4 MG/2ML IJ SOLN
INTRAMUSCULAR | Status: AC
  Filled 2020-11-13: qty 2

## 2020-11-13 MED ORDER — PHENYLEPHRINE HCL (PRESSORS) 10 MG/ML IV SOLN
INTRAVENOUS | Status: DC | PRN
  Administered 2020-11-13: 100 ug via INTRAVENOUS
  Administered 2020-11-13 (×2): 50 ug via INTRAVENOUS
  Administered 2020-11-13 (×5): 100 ug via INTRAVENOUS

## 2020-11-13 MED ORDER — PHENYLEPHRINE HCL (PRESSORS) 10 MG/ML IV SOLN
INTRAVENOUS | Status: AC
  Filled 2020-11-13: qty 1

## 2020-11-13 MED ORDER — PROPOFOL 10 MG/ML IV BOLUS
INTRAVENOUS | Status: DC | PRN
  Administered 2020-11-13: 100 mg via INTRAVENOUS

## 2020-11-13 MED ORDER — ORAL CARE MOUTH RINSE: 15.0000 mL | Freq: Once | OROMUCOSAL | Status: AC

## 2020-11-13 SURGICAL SUPPLY — 54 items
ADH SKN CLS APL DERMABOND .7 (GAUZE/BANDAGES/DRESSINGS) ×1
APL PRP STRL LF DISP 70% ISPRP (MISCELLANEOUS) ×1
BAG INFUSER PRESSURE 100CC (MISCELLANEOUS) IMPLANT
BAG SPEC RTRVL LRG 6X4 10 (ENDOMECHANICALS) ×1
BLADE SURG SZ11 CARB STEEL (BLADE) ×2 IMPLANT
CANISTER SUCT 1200ML W/VALVE (MISCELLANEOUS) ×1 IMPLANT
CANNULA REDUC XI 12-8 STAPL (CANNULA) ×1
CANNULA REDUCER 12-8 DVNC XI (CANNULA) ×1 IMPLANT
CHLORAPREP W/TINT 26 (MISCELLANEOUS) ×2 IMPLANT
CLIP VESOLOCK MED LG 6/CT (CLIP) ×2 IMPLANT
DECANTER SPIKE VIAL GLASS SM (MISCELLANEOUS) ×4 IMPLANT
DEFOGGER SCOPE WARMER CLEARIFY (MISCELLANEOUS) ×2 IMPLANT
DERMABOND ADVANCED (GAUZE/BANDAGES/DRESSINGS) ×1
DERMABOND ADVANCED .7 DNX12 (GAUZE/BANDAGES/DRESSINGS) ×1 IMPLANT
DRAPE ARM DVNC X/XI (DISPOSABLE) ×4 IMPLANT
DRAPE COLUMN DVNC XI (DISPOSABLE) ×1 IMPLANT
DRAPE DA VINCI XI ARM (DISPOSABLE) ×4
DRAPE DA VINCI XI COLUMN (DISPOSABLE) ×1
ELECT REM PT RETURN 9FT ADLT (ELECTROSURGICAL) ×2
ELECTRODE REM PT RTRN 9FT ADLT (ELECTROSURGICAL) ×1 IMPLANT
GAUZE 4X4 16PLY ~~LOC~~+RFID DBL (SPONGE) ×2 IMPLANT
GLOVE SURG ENC MOIS LTX SZ6.5 (GLOVE) ×5 IMPLANT
GLOVE SURG UNDER POLY LF SZ6.5 (GLOVE) ×5 IMPLANT
GOWN STRL REUS W/ TWL LRG LVL3 (GOWN DISPOSABLE) ×3 IMPLANT
GOWN STRL REUS W/TWL LRG LVL3 (GOWN DISPOSABLE) ×6
GRASPER SUT TROCAR 14GX15 (MISCELLANEOUS) ×3 IMPLANT
IRRIGATOR SUCT 8 DISP DVNC XI (IRRIGATION / IRRIGATOR) IMPLANT
IRRIGATOR SUCTION 8MM XI DISP (IRRIGATION / IRRIGATOR)
IV NS 1000ML (IV SOLUTION)
IV NS 1000ML BAXH (IV SOLUTION) IMPLANT
KIT PINK PAD W/HEAD ARE REST (MISCELLANEOUS) ×2
KIT PINK PAD W/HEAD ARM REST (MISCELLANEOUS) ×1 IMPLANT
LABEL OR SOLS (LABEL) ×2 IMPLANT
MANIFOLD NEPTUNE II (INSTRUMENTS) ×2 IMPLANT
NDL INSUFFLATION 14GA 120MM (NEEDLE) ×1 IMPLANT
NEEDLE HYPO 22GX1.5 SAFETY (NEEDLE) ×2 IMPLANT
NEEDLE INSUFFLATION 14GA 120MM (NEEDLE) ×2 IMPLANT
NS IRRIG 500ML POUR BTL (IV SOLUTION) ×2 IMPLANT
OBTURATOR OPTICAL STANDARD 8MM (TROCAR) ×1
OBTURATOR OPTICAL STND 8 DVNC (TROCAR) ×1
OBTURATOR OPTICALSTD 8 DVNC (TROCAR) ×1 IMPLANT
PACK LAP CHOLECYSTECTOMY (MISCELLANEOUS) ×2 IMPLANT
POUCH SPECIMEN RETRIEVAL 10MM (ENDOMECHANICALS) ×2 IMPLANT
SEAL CANN UNIV 5-8 DVNC XI (MISCELLANEOUS) ×3 IMPLANT
SEAL XI 5MM-8MM UNIVERSAL (MISCELLANEOUS) ×3
SET TUBE SMOKE EVAC HIGH FLOW (TUBING) ×2 IMPLANT
SOLUTION ELECTROLUBE (MISCELLANEOUS) ×2 IMPLANT
SPONGE T-LAP 4X18 ~~LOC~~+RFID (SPONGE) IMPLANT
STAPLER CANNULA SEAL DVNC XI (STAPLE) ×1 IMPLANT
STAPLER CANNULA SEAL XI (STAPLE) ×1
SUT MNCRL 4-0 (SUTURE) ×2
SUT MNCRL 4-0 27XMFL (SUTURE) ×1
SUT VICRYL 0 AB UR-6 (SUTURE) ×2 IMPLANT
SUTURE MNCRL 4-0 27XMF (SUTURE) ×1 IMPLANT

## 2020-11-13 NOTE — Anesthesia Postprocedure Evaluation (Signed)
Anesthesia Post Note  Patient: [name]  Procedure(s) Performed: XI ROBOTIC ASSISTED LAPAROSCOPIC CHOLECYSTECTOMY (Abdomen)  Patient location during evaluation: PACU Anesthesia Type: General Level of consciousness: awake and alert Pain management: pain level controlled Vital Signs Assessment: post-procedure vital signs reviewed and stable Respiratory status: spontaneous breathing, nonlabored ventilation, respiratory function stable and patient connected to nasal cannula oxygen Cardiovascular status: blood pressure returned to baseline and stable Postop Assessment: no apparent nausea or vomiting Anesthetic complications: no   No notable events documented.   Last Vitals:  Vitals:   11/13/20 0939 11/13/20 0955  BP: 112/72 (!) 133/95  Pulse: 92 (!) 122  Resp: (!) 21 18  Temp: (!) 36.2 C (!) 36.1 C  SpO2: 98% 100%    Last Pain:  Vitals:   11/13/20 0955  TempSrc: Temporal  PainSc: 6                  Cleda Mccreedy Emmogene Simson

## 2020-11-13 NOTE — Discharge Instructions (Addendum)

## 2020-11-13 NOTE — Transfer of Care (Signed)
Immediate Anesthesia Transfer of Care Note  Patient: [facility]  Procedure(s) Performed: XI ROBOTIC ASSISTED LAPAROSCOPIC CHOLECYSTECTOMY (Abdomen)  Patient Location: PACU  Anesthesia Type:General  Level of Consciousness: drowsy and patient cooperative  Airway & Oxygen Therapy: Patient Spontanous Breathing and Patient connected to face mask oxygen  Post-op Assessment: Report given to RN and Post -op Vital signs reviewed and stable  Post vital signs: Reviewed and stable  Last Vitals:  Vitals Value Taken Time  BP 144/97 11/13/20 0838  Temp    Pulse 86 11/13/20 0841  Resp 14 11/13/20 0841  SpO2 100 % 11/13/20 0841  Vitals shown include unvalidated device data.  Last Pain:  Vitals:   11/13/20 0628  TempSrc: Oral  PainSc: 0-No pain         Complications: No notable events documented.

## 2020-11-13 NOTE — Anesthesia Procedure Notes (Signed)
Procedure Name: Intubation Date/Time: 11/13/2020 7:36 AM Performed by: Elmarie Mainland, CRNA Pre-anesthesia Checklist: Patient identified, Emergency Drugs available, Suction available and Patient being monitored Patient Re-evaluated:Patient Re-evaluated prior to induction Oxygen Delivery Method: Circle system utilized Preoxygenation: Pre-oxygenation with 100% oxygen Induction Type: IV induction Ventilation: Mask ventilation without difficulty and Oral airway inserted - appropriate to patient size Laryngoscope Size: McGraph and 3 Grade View: Grade I Tube type: Oral Number of attempts: 1 Airway Equipment and Method: Stylet and Oral airway Placement Confirmation: ETT inserted through vocal cords under direct vision, positive ETCO2 and breath sounds checked- equal and bilateral Secured at: 22 cm Tube secured with: Tape Dental Injury: Teeth and Oropharynx as per pre-operative assessment

## 2020-11-13 NOTE — Anesthesia Preprocedure Evaluation (Addendum)
Anesthesia Evaluation  Patient identified by MRN, date of birth, ID band Patient awake    Reviewed: Allergy & Precautions, NPO status , Patient's Chart, lab work & pertinent test results  History of Anesthesia Complications Negative for: history of anesthetic complications  Airway Mallampati: III  TM Distance: <3 FB Neck ROM: full    Dental  (+) Chipped   Pulmonary neg pulmonary ROS, neg shortness of breath,    Pulmonary exam normal        Cardiovascular Exercise Tolerance: Good hypertension, + CAD and + Cardiac Stents  + dysrhythmias Atrial Fibrillation  Rhythm:irregular Rate:Tachycardia     Neuro/Psych negative neurological ROS  negative psych ROS   GI/Hepatic negative GI ROS, Neg liver ROS, neg GERD  ,  Endo/Other  diabetes, Type 2, Insulin DependentHypothyroidism   Renal/GU      Musculoskeletal   Abdominal   Peds  Hematology negative hematology ROS (+)   Anesthesia Other Findings Past Medical History: No date: Coronary artery disease     Comment:  a.) s/p Synergy DES on 08/18/2019 in [place] No date: Hyperlipidemia No date: Hypertension No date: Hypothyroidism No date: PAF (paroxysmal atrial fibrillation) (HCC) No date: T2DM (type 2 diabetes mellitus) (HCC)  Past Surgical History: 1986: ABDOMINAL HYSTERECTOMY No date: COLONOSCOPY 08/18/2019: CORONARY ANGIOPLASTY WITH STENT PLACEMENT; Right     Comment:  Synergy DES placed in [place] 2009: HAMMER TOE SURGERY; Left     Comment:  1st,2nd,3rd toes No date: TONSILLECTOMY  BMI    Body Mass Index: 35.53 kg/m      Reproductive/Obstetrics negative OB ROS                            Anesthesia Physical Anesthesia Plan  ASA: 3  Anesthesia Plan: General ETT   Post-op Pain Management:    Induction: Intravenous  PONV Risk Score and Plan: Ondansetron, Dexamethasone, Midazolam and Treatment may vary due to age or medical  condition  Airway Management Planned: Oral ETT  Additional Equipment:   Intra-op Plan:   Post-operative Plan: Extubation in OR  Informed Consent: I have reviewed the patients History and Physical, chart, labs and discussed the procedure including the risks, benefits and alternatives for the proposed anesthesia with the patient or authorized representative who has indicated his/her understanding and acceptance.     Dental Advisory Given  Plan Discussed with: Anesthesiologist, CRNA and Surgeon  Anesthesia Plan Comments: (Patient consented for risks of anesthesia including but not limited to:  - adverse reactions to medications - damage to eyes, teeth, lips or other oral mucosa - nerve damage due to positioning  - sore throat or hoarseness - Damage to heart, brain, nerves, lungs, other parts of body or loss of life  Patient voiced understanding.)        Anesthesia Quick Evaluation

## 2020-11-13 NOTE — Op Note (Signed)
Preoperative diagnosis: Cholelithiasis  Postoperative diagnosis: Same  Procedure: Robotic Assisted Laparoscopic Cholecystectomy.   Anesthesia: GETA   Surgeon: Dr. Hazle Quant  Wound Classification: Clean Contaminated  Indications: Patient is a 80 y.o. female > 3 cm gallstone.  Robotic Assisted Laparoscopic cholecystectomy was indicated due to increased risk of gallbladder cancer.  Findings: Large >3 cm gallstone  Critical view of safety achieved Cystic duct and artery identified, ligated and divided Adequate hemostasis  Description of procedure: The patient was placed on the operating table in the supine position. General anesthesia was induced. A time-out was completed verifying correct patient, procedure, site, positioning, and implant(s) and/or special equipment prior to beginning this procedure. An orogastric tube was placed. The abdomen was prepped and draped in the usual sterile fashion.  An incision was made in a natural skin line below the umbilicus.  The fascia was elevated and the Veress needle inserted. Proper position was confirmed by aspiration and saline meniscus test.  The abdomen was insufflated with carbon dioxide to a pressure of 15 mmHg. The patient tolerated insufflation well. A 8-mm trocar was then inserted in optiview fashion.  The laparoscope was inserted and the abdomen inspected. No injuries from initial trocar placement were noted. Additional trocars were then inserted in the following locations: an 8-mm trocar in the left lateral abdomen, and another two 8-mm trocars to the right side of the abdomen 5 cm appart. The umbilical trocar was changed to a 12 mm trocar all under direct visualization. The abdomen was inspected and no abnormalities were found. The table was placed in the reverse Trendelenburg position with the right side up. The robotic arms were docked and target anatomy identified. Instrument inserted under direct visualization.  Filmy adhesions between  the gallbladder and omentum, duodenum and transverse colon were lysed with electrocautery. The dome of the gallbladder was grasped with a prograsp and retracted over the dome of the liver. The infundibulum was also grasped with an atraumatic grasper and retracted toward the right lower quadrant. This maneuver exposed Calot's triangle. The peritoneum overlying the gallbladder infundibulum was then incised and the cystic duct and cystic artery identified and circumferentially dissected. Critical view of safety reviewed before ligating any structure. Firefly images taken to visualize biliary ducts. The cystic duct and cystic artery were then doubly clipped and divided close to the gallbladder.  The gallbladder was then dissected from its peritoneal attachments by electrocautery. Hemostasis was checked and the gallbladder and contained stones were removed using an endoscopic retrieval bag. The gallbladder was passed off the table as a specimen. There was no evidence of bleeding from the gallbladder fossa or cystic artery or leakage of the bile from the cystic duct stump. Secondary trocars were removed under direct vision. No bleeding was noted. The robotic arms were undoked. The scope was withdrawn and the umbilical trocar removed. The abdomen was allowed to collapse. The fascia of the 59mm trocar sites was closed with figure-of-eight 0 vicryl sutures. The skin was closed with subcuticular sutures of 4-0 monocryl and topical skin adhesive. The orogastric tube was removed.  The patient tolerated the procedure well and was taken to the postanesthesia care unit in stable condition.   Specimen: Gallbladder  Complications: None  EBL: 5 mL

## 2020-11-13 NOTE — Interval H&P Note (Signed)
History and Physical Interval Note:  11/13/2020 6:49 AM  Sue Andrews  has presented today for surgery, with the diagnosis of K80.20 Cholelithiasis w/o cholecystitis.  The various methods of treatment have been discussed with the patient and family. After consideration of risks, benefits and other options for treatment, the patient has consented to  Procedure(s): XI ROBOTIC ASSISTED LAPAROSCOPIC CHOLECYSTECTOMY (N/A) as a surgical intervention.  The patient's history has been reviewed, patient examined, no change in status, stable for surgery.  I have reviewed the patient's chart and labs.  Questions were answered to the patient's satisfaction.     Carolan Shiver

## 2020-11-14 LAB — SURGICAL PATHOLOGY

## 2021-04-18 ENCOUNTER — Emergency Department: Payer: Medicare Other

## 2021-04-18 DIAGNOSIS — I10 Essential (primary) hypertension: Secondary | ICD-10-CM | POA: Diagnosis not present

## 2021-04-18 DIAGNOSIS — I2510 Atherosclerotic heart disease of native coronary artery without angina pectoris: Secondary | ICD-10-CM | POA: Diagnosis not present

## 2021-04-18 DIAGNOSIS — I4891 Unspecified atrial fibrillation: Secondary | ICD-10-CM | POA: Diagnosis not present

## 2021-04-18 DIAGNOSIS — L03116 Cellulitis of left lower limb: Secondary | ICD-10-CM | POA: Insufficient documentation

## 2021-04-18 DIAGNOSIS — M7989 Other specified soft tissue disorders: Secondary | ICD-10-CM | POA: Diagnosis present

## 2021-04-18 LAB — COMPREHENSIVE METABOLIC PANEL
ALT: 30 U/L (ref 0–44)
AST: 38 U/L (ref 15–41)
Albumin: 3.7 g/dL (ref 3.5–5.0)
Alkaline Phosphatase: 106 U/L (ref 38–126)
Anion gap: 8 (ref 5–15)
BUN: 27 mg/dL — ABNORMAL HIGH (ref 8–23)
CO2: 31 mmol/L (ref 22–32)
Calcium: 9.6 mg/dL (ref 8.9–10.3)
Chloride: 101 mmol/L (ref 98–111)
Creatinine, Ser: 0.7 mg/dL (ref 0.44–1.00)
GFR, Estimated: >60 mL/min (ref >60)
Glucose, Bld: 133 mg/dL — ABNORMAL HIGH (ref 70–99)
Potassium: 3.8 mmol/L (ref 3.5–5.1)
Sodium: 140 mmol/L (ref 135–145)
Total Bilirubin: 1 mg/dL (ref 0.3–1.2)
Total Protein: 6.7 g/dL (ref 6.5–8.1)

## 2021-04-18 LAB — CBC WITH DIFFERENTIAL/PLATELET
Abs Immature Granulocytes: 0.01 10*3/uL (ref 0.00–0.07)
Basophils Absolute: 0.1 10*3/uL (ref 0.0–0.1)
Basophils Relative: 1 %
Eosinophils Absolute: 0.3 10*3/uL (ref 0.0–0.5)
Eosinophils Relative: 7 %
HCT: 41.9 % (ref 36.0–46.0)
Hemoglobin: 14 g/dL (ref 12.0–15.0)
Immature Granulocytes: 0 %
Lymphocytes Relative: 32 %
Lymphs Abs: 1.7 10*3/uL (ref 0.7–4.0)
MCH: 32.3 pg (ref 26.0–34.0)
MCHC: 33.4 g/dL (ref 30.0–36.0)
MCV: 96.8 fL (ref 80.0–100.0)
Monocytes Absolute: 0.6 10*3/uL (ref 0.1–1.0)
Monocytes Relative: 11 %
Neutro Abs: 2.6 10*3/uL (ref 1.7–7.7)
Neutrophils Relative %: 49 %
Platelets: 167 10*3/uL (ref 150–400)
RBC: 4.33 MIL/uL (ref 3.87–5.11)
RDW: 14.8 % (ref 11.5–15.5)
WBC: 5.2 10*3/uL (ref 4.0–10.5)
nRBC: 0 % (ref 0.0–0.2)

## 2021-04-18 LAB — PROTIME-INR
INR: 1.2 (ref 0.8–1.2)
Prothrombin Time: 15.3 seconds — ABNORMAL HIGH (ref 11.4–15.2)

## 2021-04-18 LAB — APTT: aPTT: 33 seconds (ref 24–36)

## 2021-04-18 NOTE — ED Triage Notes (Signed)
Pt was referred here by [facility] with complaints of left leg swelling and was advised to be seen here for an [place] of her leg. She endorses bilateral lower extremity neuropathy and is a T2DM. She denies any injury or falls recently. Denies hx of DVT - pt takes eliquis for recent stent placement. Denies SOB or CP.

## 2021-04-19 ENCOUNTER — Emergency Department
Admission: EM | Admit: 2021-04-19 | Discharge: 2021-04-19 | Disposition: A | Discharge status: Home / Self Care | Payer: Medicare Other | Attending: Emergency Medicine | Admitting: Emergency Medicine

## 2021-04-19 DIAGNOSIS — L03116 Cellulitis of left lower limb: Secondary | ICD-10-CM

## 2021-04-19 LAB — PROCALCITONIN: Procalcitonin: <0.1 ng/mL

## 2021-04-19 MED ORDER — CEPHALEXIN 500 MG PO CAPS: 500.0000 mg | ORAL_CAPSULE | Freq: Four times a day (QID) | ORAL | 0 refills | Status: AC

## 2021-04-19 NOTE — ED Provider Notes (Signed)
[facility] Provider Note    Event Date/Time   First MD Initiated Contact with Patient 04/19/21 0258     (approximate)   History   Leg Swelling   HPI  Sue Andrews is a 81 y.o. female with a past medical history of CAD Status post Synergy DES 08/18/2019, HTN, HDL, paroxysmal A. 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  She denies any injuries to this area.  She has not had any fevers, cough, chest pain, abdominal pain, vomiting, diarrhea, rash or any other extremity pain.  He states she was told to come emergency room to rule out a blood clot by her PCP although she states she think she had a cellulitis Hx having very similar a while back in the right lower extremity respond well to antibiotics.  No other acute concerns at this time      Physical Exam  Triage Vital Signs: ED Triage Vitals  Enc Vitals Group     BP 04/18/21 1908 (!) 136/94     Pulse Rate 04/18/21 1908 88     Resp 04/18/21 1908 18     Temp 04/18/21 1908 98 F (36.7 C)     Temp Source 04/18/21 1908 Oral     SpO2 04/18/21 1908 98 %     Weight 04/18/21 1908 220 lb (99.8 kg)     Height 04/18/21 1908 5\' 3"  (1.6 m)     Head Circumference --      Peak Flow --      Pain Score 04/18/21 1909 5     Pain Loc --      Pain Edu? --      Excl. in GC? --     Most recent vital signs: Vitals:   04/18/21 1908 04/18/21 2111  BP: (!) 136/94 (!) 109/55  Pulse: 88 78  Resp: 18 17  Temp: 98 F (36.7 C)   SpO2: 98% 97%    General: Awake, no distress.  CV:  Good peripheral perfusion.  Resp:  Normal effort.  Abd:  No distention.  Other:  Bilateral lower extremity edema.  2+ bilateral DP pulses.  Sensation intact light touch throughout both feet.  Patient has full range of motion of the feet and ankles.   There is a oval-shaped area of erythema that is warm and tender approximately 3 x 5 cm over the mid anterior calf.  No other overlying skin changes fluctuance, bleeding, induration or other changes.   ED Results / Procedures / Treatments  Labs (all labs ordered are listed, but only abnormal results are displayed) Labs Reviewed  COMPREHENSIVE METABOLIC PANEL - Abnormal; Notable for the following components:      Result Value   Glucose, Bld 133 (*)    BUN 27 (*)    All other components within normal limits  PROTIME-INR - Abnormal; Notable for the following components:   Prothrombin Time 15.3 (*)    All other components within normal limits  CBC WITH DIFFERENTIAL/PLATELET  APTT  PROCALCITONIN     EKG   RADIOLOGY  I reviewed patient's ultrasound of the left lower extremity which showed no evidence of DVT cyst or abscess.  I also reviewed radiology's interpretation and agree with the findings.    PROCEDURES:  Critical Care performed: No  Procedures  MEDICATIONS ORDERED IN ED: Medications - No data to display   IMPRESSION / MDM / ASSESSMENT AND PLAN / ED COURSE  I reviewed the triage vital signs and the nursing notes.                              Differential diagnosis includes, but is not limited to, venous stasis dermatitis, cellulitis, DVT.  Compartments are soft and I have low suspicion for compartment syndrome or necrotizing infection.  I reviewed patient's ultrasound of the left lower extremity which showed no evidence of DVT cyst or abscess.  I also reviewed radiology's interpretation and agree with the findings.   CMP without significant electrolyte or metabolic derangements.  BC without leukocytosis or acute anemia.  Coagulation studies obtained in triage are unremarkable.  Procalcitonin is undetectable.  Low suspicion for other significant metabolic derangement and I do not believe patient is septic.  She is able to bear weight.  Will cover with a course of  Keflex as there is no purulence and have a low suspicion for MRSA staph.  Advise close outpatient PCP follow-up.  Discharged in stable condition.      FINAL CLINICAL IMPRESSION(S) / ED DIAGNOSES   Final diagnoses:  Cellulitis of left lower extremity     Rx / DC Orders   ED Discharge Orders          Ordered    cephALEXin (KEFLEX) 500 MG capsule  4 times daily        04/19/21 0327             Note:  This document was prepared using Dragon voice recognition software and may include unintentional dictation errors.   Gilles Chiquito, MD 04/19/21 [phone]

## 2021-07-23 ENCOUNTER — Ambulatory Visit (INDEPENDENT_AMBULATORY_CARE_PROVIDER_SITE_OTHER): Payer: Medicare Other | Admitting: Nurse Practitioner

## 2021-07-23 ENCOUNTER — Encounter: Payer: Self-pay | Admitting: Nurse Practitioner

## 2021-07-23 VITALS — BP 130/80 | HR 126 | Temp 97.9°F | Ht 63.0 in | Wt 212.0 lb

## 2021-07-23 DIAGNOSIS — Z23 Encounter for immunization: Secondary | ICD-10-CM | POA: Diagnosis not present

## 2021-07-23 DIAGNOSIS — E1142 Type 2 diabetes mellitus with diabetic polyneuropathy: Secondary | ICD-10-CM | POA: Diagnosis not present

## 2021-07-23 DIAGNOSIS — Z0001 Encounter for general adult medical examination with abnormal findings: Secondary | ICD-10-CM | POA: Diagnosis not present

## 2021-07-23 DIAGNOSIS — M79672 Pain in left foot: Secondary | ICD-10-CM

## 2021-07-23 DIAGNOSIS — E785 Hyperlipidemia, unspecified: Secondary | ICD-10-CM

## 2021-07-23 DIAGNOSIS — I4811 Longstanding persistent atrial fibrillation: Secondary | ICD-10-CM

## 2021-07-23 DIAGNOSIS — Z0000 Encounter for general adult medical examination without abnormal findings: Secondary | ICD-10-CM

## 2021-07-23 DIAGNOSIS — E039 Hypothyroidism, unspecified: Secondary | ICD-10-CM | POA: Diagnosis not present

## 2021-07-23 DIAGNOSIS — Z13820 Encounter for screening for osteoporosis: Secondary | ICD-10-CM

## 2021-07-23 DIAGNOSIS — Z7689 Persons encountering health services in other specified circumstances: Secondary | ICD-10-CM

## 2021-07-23 DIAGNOSIS — I10 Essential (primary) hypertension: Secondary | ICD-10-CM

## 2021-07-23 DIAGNOSIS — Z1159 Encounter for screening for other viral diseases: Secondary | ICD-10-CM

## 2021-07-23 NOTE — Patient Instructions (Addendum)
[facility]  ?[address], [place], [place] [address]  ?[phone]  ? ? ?

## 2021-07-23 NOTE — Progress Notes (Signed)
? ?Subjective:  ? ? Patient ID: Sue Andrews, female    DOB: Jun 13, 1940, 81 y.o.   MRN: [id number] ? ?HPI ?AWV- Annual Wellness Visit ? ?The patient was seen for their annual wellness visit. ?The patient's past medical history, surgical history, and family history were reviewed. ?Pertinent vaccines were reviewed ( tetanus, pneumonia, shingles, flu) ?The patient's medication list was reviewed and updated. ? ?The height and weight were entered.  ?BMI recorded in electronic record elsewhere ? ?Cognitive screening was completed. ?Outcome of Mini - Cog: 5 ?  ?Falls /depression screening electronically recorded within record elsewhere ? ?Current tobacco usage:no ?(All patients who use tobacco were given written and verbal information on quitting) ? ?Recent listing of emergency department/hospitalizations over the past year were reviewed. ? current specialist the patient sees on a regular basis:  ?-cardiology / wants a referral to a local  ? ? ?Medicare annual wellness visit patient questionnaire was reviewed. ? A written screening schedule for the patient for the next 5-10 years was given. ?Appropriate discussion of followup regarding next visit was discussed. ? ? ?Longstanding persistent atrial fibrillation ([place]) ?Patient takes apixaban 5 mg twice a day for longstanding A-fib.  Patient also takes metoprolol 25 mg twice a day for rate control.  Patient states that she developed A-fib 12 years ago without provocation.  Patient sees a cardiologist for follow-up of A-fib.  Patient requesting to see a cardiologist within [place] so she does not have to commute to [place]. ? ?Hyperlipidemia, unspecified hyperlipidemia type ?Patient states that she was previously on a statin however her cardiologist took her off. ? ?Primary hypertension ?Patient has hypertension and takes losartan 25 mg.  Patient also taking Lasix 40 mg daily.  Patient states that blood pressure typically runs normal.  Blood pressure 130/80 today.   Patient denies any chest pain, palpitations, difficulty breathing, shortness of breath, or new swelling to legs. ? ?Controlled type 2 diabetes mellitus with diabetic polyneuropathy, without long-term current use of insulin ([place]) ?Patient takes Humalog 75/25 mix 36 units twice a day before breakfast and dinner for blood sugar control.  Patient states that she was previously told to decrease her insulin from 40 units to 36 units.  Patient states that she has been doing well with that change.  Last A1c was 5.6. ? ?Hypothyroidism, unspecified type ?Patient takes Synthroid 25 mcg for hypothyroidism.  Patient denies any signs symptoms of hyper or hypothyroidism at this time. ? ?Left foot pain ?Patient states that she broke her fourth toe to her left foot many years ago and it is now starting to give her trouble again.  Patient requesting to see podiatry for examination. ? ? ?Review of Systems  ?All other systems reviewed and are negative. ? ?   ?Objective:  ? Physical Exam ?Vitals reviewed.  ?Constitutional:   ?   General: She is not in acute distress. ?   Appearance: Normal appearance. She is obese. She is not ill-appearing, toxic-appearing or diaphoretic.  ?HENT:  ?   Head: Normocephalic and atraumatic.  ?Cardiovascular:  ?   Rate and Rhythm: Normal rate. Rhythm irregular.  ?   Pulses: Normal pulses.  ?   Heart sounds: Normal heart sounds. No murmur heard. ?  No friction rub. No gallop.  ?Pulmonary:  ?   Effort: Pulmonary effort is normal. No respiratory distress.  ?   Breath sounds: Normal breath sounds. No wheezing.  ?Musculoskeletal:     ?   General: Swelling present.  ?   Cervical  back: Normal range of motion and neck supple.  ?   Comments: Swelling to bilateral legs.  Patient currently wearing compression stockings. ? ?Patient ambulates with a cane.  ?Skin: ?   General: Skin is warm.  ?   Capillary Refill: Capillary refill takes less than 2 seconds.  ?Neurological:  ?   Mental Status: She is alert.  ?   Comments:  Grossly intact  ?Psychiatric:     ?   Mood and Affect: Mood normal.     ?   Behavior: Behavior normal.  ? ?   ?Assessment & Plan:  ? ?1. Encounter for Medicare annual wellness exam ?Adult wellness-complete.wellness physical was conducted today. Importance of diet and exercise were discussed in detail.  ?In addition to this a discussion regarding safety was also covered. We also reviewed over immunizations and gave recommendations regarding current immunization needed for age.  ?In addition to this additional areas were also touched on including: ?Preventative health exams needed: ? ?-Colonoscopy Last colonoscopy 2012. Patient older than 61 and has choice to get Colonoscopy. Will discuss risk benefits in detail at next visit ?-Mammogram no longer indicated past age 21. Will discuss with patient risk benefits in detail with patient at next visit.  ?-Dexa Scan: recommended. Will scheduled patient. ?-Eye exam due. Patient encouraged to schedule appointment with opthalmology. ?-Dental Exam Current ?-Tetanus due today ?- Screening for Hep C due today ?- Foot exam per patient current ? ?Patient was advised yearly wellness exam  ? ?2. Longstanding persistent atrial fibrillation ([place]) ?-Patient to continue apixaban and metoprolol as prescribed. ?- Ambulatory referral to Cardiology ?-Follow-up in 3 months. ? ?3. Hyperlipidemia, unspecified hyperlipidemia type ?-Patient not currently on statin. ?-We will evaluate current lipid profile and determine if statin is necessary.  Both ?- Lipid Profile ?-Follow up in 3 months ? ?5. Primary hypertension ?-Blood pressure today is 130/80.  Goal of less than 140/90 met. ?-Noticed that patient is on Lasix without potassium. ?-We will assess current potassium level and determine if potassium supplementation is necessary. ?- CMP14+EGFR ?-Return to clinic in 3 months ? ?6. Controlled type 2 diabetes mellitus with diabetic polyneuropathy, without long-term current use of insulin  ([place]) ?-Current A1c is 5.6. ?-We will evaluate A1c to determine if any changes to insulin will be needed. ?-Discussed dangers of controlling blood sugars too tightly as we age such as hypoglycemic events.  Patient stated understanding. ?- CMP14+EGFR ?- HgB A1c ?-Return to clinic in 3 months ? ?7. Encounter for hepatitis C screening test for low risk patient ?- Hepatitis C Antibody ? ?8. Hypothyroidism, unspecified type ?-Continue taking Synthroid as prescribed. ?-We will evaluate T SH to determine if changes to Synthroid as necessary. ?- TSH + free T4 ? ?9. Left foot pain ?-Patient provided with name and contact information for [facility]. ?-Patient states that she would like to contact them first to determine if she would like a referral there. ?-Return to clinic in 3 months ? ?10. Immunization due ?- Td vaccine greater than or equal to 7yo preservative free IM ? ?  ?Note:  This document was prepared using Dragon voice recognition software and may include unintentional dictation errors.  ? ?

## 2021-07-24 ENCOUNTER — Encounter: Payer: Self-pay | Admitting: Nurse Practitioner

## 2021-07-24 ENCOUNTER — Telehealth: Payer: Self-pay | Admitting: Nurse Practitioner

## 2021-07-24 LAB — CMP14+EGFR
ALT: 28 IU/L (ref 0–32)
AST: 39 IU/L (ref 0–40)
Albumin/Globulin Ratio: 1.7 (ref 1.2–2.2)
Albumin: 4.4 g/dL (ref 3.7–4.7)
Alkaline Phosphatase: 127 IU/L — ABNORMAL HIGH (ref 44–121)
BUN/Creatinine Ratio: 25 (ref 12–28)
BUN: 20 mg/dL (ref 8–27)
Bilirubin Total: 0.8 mg/dL (ref 0.0–1.2)
CO2: 26 mmol/L (ref 20–29)
Calcium: 9.6 mg/dL (ref 8.7–10.3)
Chloride: 101 mmol/L (ref 96–106)
Creatinine, Ser: 0.8 mg/dL (ref 0.57–1.00)
Globulin, Total: 2.6 g/dL (ref 1.5–4.5)
Glucose: 98 mg/dL (ref 70–99)
Potassium: 4.1 mmol/L (ref 3.5–5.2)
Sodium: 143 mmol/L (ref 134–144)
Total Protein: 7 g/dL (ref 6.0–8.5)
eGFR: 75 mL/min/{1.73_m2} (ref >59)

## 2021-07-24 LAB — LIPID PANEL
Chol/HDL Ratio: 3.1 ratio (ref 0.0–4.4)
Cholesterol, Total: 211 mg/dL — ABNORMAL HIGH (ref 100–199)
HDL: 69 mg/dL (ref >39)
LDL Chol Calc (NIH): 115 mg/dL — ABNORMAL HIGH (ref 0–99)
Triglycerides: 159 mg/dL — ABNORMAL HIGH (ref 0–149)
VLDL Cholesterol Cal: 27 mg/dL (ref 5–40)

## 2021-07-24 LAB — TSH+FREE T4
Free T4: 1.33 ng/dL (ref 0.82–1.77)
TSH: 2.25 u[IU]/mL (ref 0.450–4.500)

## 2021-07-24 LAB — HEPATITIS C ANTIBODY: Hep C Virus Ab: NONREACTIVE

## 2021-07-24 LAB — HEMOGLOBIN A1C
Est. average glucose Bld gHb Est-mCnc: 111 mg/dL
Hgb A1c MFr Bld: 5.5 % (ref 4.8–5.6)

## 2021-07-24 NOTE — Addendum Note (Signed)
Addended by: Claire Shown on: 07/24/2021 12:52 PM ? ? Modules accepted: Level of Service ? ?

## 2021-07-24 NOTE — Telephone Encounter (Signed)
[facility] requesting refill on Metoprolol 25 mg, Eliquis 5 mg, Levothyroxine 25 mg and Furosemide 20 mg. Pt seen yesterday for wellness. Please advise. Thank you ?

## 2021-07-25 ENCOUNTER — Other Ambulatory Visit: Payer: Self-pay | Admitting: Nurse Practitioner

## 2021-07-25 MED ORDER — ROSUVASTATIN CALCIUM 5 MG PO TABS: 5.0000 mg | ORAL_TABLET | Freq: Every day | ORAL | 3 refills | Status: DC

## 2021-07-25 MED ORDER — METOPROLOL TARTRATE 25 MG PO TABS: 25.0000 mg | ORAL_TABLET | Freq: Two times a day (BID) | ORAL | 1 refills | Status: DC

## 2021-07-25 MED ORDER — LEVOTHYROXINE SODIUM 25 MCG PO TABS: 25.0000 ug | ORAL_TABLET | Freq: Every day | ORAL | 1 refills | Status: DC

## 2021-07-25 MED ORDER — FUROSEMIDE 20 MG PO TABS: 40.0000 mg | ORAL_TABLET | Freq: Every day | ORAL | 3 refills | Status: DC

## 2021-07-25 MED ORDER — ELIQUIS 5 MG PO TABS: 5.0000 mg | ORAL_TABLET | Freq: Two times a day (BID) | ORAL | 1 refills | Status: DC

## 2021-07-26 ENCOUNTER — Other Ambulatory Visit: Payer: Self-pay | Admitting: Nurse Practitioner

## 2021-07-26 DIAGNOSIS — E785 Hyperlipidemia, unspecified: Secondary | ICD-10-CM

## 2021-08-27 DIAGNOSIS — E785 Hyperlipidemia, unspecified: Secondary | ICD-10-CM | POA: Diagnosis not present

## 2021-08-28 LAB — CMP14+EGFR
ALT: 26 IU/L (ref 0–32)
AST: 31 IU/L (ref 0–40)
Albumin/Globulin Ratio: 1.9 (ref 1.2–2.2)
Albumin: 4.1 g/dL (ref 3.7–4.7)
Alkaline Phosphatase: 112 IU/L (ref 44–121)
BUN/Creatinine Ratio: 24 (ref 12–28)
BUN: 19 mg/dL (ref 8–27)
Bilirubin Total: 0.8 mg/dL (ref 0.0–1.2)
CO2: 26 mmol/L (ref 20–29)
Calcium: 9.5 mg/dL (ref 8.7–10.3)
Chloride: 101 mmol/L (ref 96–106)
Creatinine, Ser: 0.8 mg/dL (ref 0.57–1.00)
Globulin, Total: 2.2 g/dL (ref 1.5–4.5)
Glucose: 216 mg/dL — ABNORMAL HIGH (ref 70–99)
Potassium: 4.4 mmol/L (ref 3.5–5.2)
Sodium: 139 mmol/L (ref 134–144)
Total Protein: 6.3 g/dL (ref 6.0–8.5)
eGFR: 75 mL/min/{1.73_m2} (ref >59)

## 2021-08-28 LAB — LIPID PANEL
Chol/HDL Ratio: 2.3 ratio (ref 0.0–4.4)
Cholesterol, Total: 152 mg/dL (ref 100–199)
HDL: 66 mg/dL (ref >39)
LDL Chol Calc (NIH): 70 mg/dL (ref 0–99)
Triglycerides: 85 mg/dL (ref 0–149)
VLDL Cholesterol Cal: 16 mg/dL (ref 5–40)

## 2021-09-11 DIAGNOSIS — E119 Type 2 diabetes mellitus without complications: Secondary | ICD-10-CM | POA: Diagnosis not present

## 2021-09-24 ENCOUNTER — Ambulatory Visit: Payer: Medicare Other | Admitting: Internal Medicine

## 2021-09-24 ENCOUNTER — Encounter: Payer: Self-pay | Admitting: Internal Medicine

## 2021-09-24 VITALS — BP 130/84 | HR 134 | Ht 63.0 in | Wt 210.8 lb

## 2021-09-24 DIAGNOSIS — E119 Type 2 diabetes mellitus without complications: Secondary | ICD-10-CM | POA: Diagnosis not present

## 2021-09-24 DIAGNOSIS — E785 Hyperlipidemia, unspecified: Secondary | ICD-10-CM

## 2021-09-24 DIAGNOSIS — I4891 Unspecified atrial fibrillation: Secondary | ICD-10-CM | POA: Diagnosis not present

## 2021-09-24 DIAGNOSIS — I10 Essential (primary) hypertension: Secondary | ICD-10-CM

## 2021-09-24 DIAGNOSIS — I25118 Atherosclerotic heart disease of native coronary artery with other forms of angina pectoris: Secondary | ICD-10-CM | POA: Diagnosis not present

## 2021-09-24 DIAGNOSIS — E1169 Type 2 diabetes mellitus with other specified complication: Secondary | ICD-10-CM

## 2021-09-24 DIAGNOSIS — I509 Heart failure, unspecified: Secondary | ICD-10-CM

## 2021-09-24 DIAGNOSIS — D6869 Other thrombophilia: Secondary | ICD-10-CM | POA: Diagnosis not present

## 2021-09-24 DIAGNOSIS — I5020 Unspecified systolic (congestive) heart failure: Secondary | ICD-10-CM

## 2021-09-24 MED ORDER — METOPROLOL SUCCINATE ER 100 MG PO TB24: 100.0000 mg | ORAL_TABLET | Freq: Every day | ORAL | 3 refills | Status: DC

## 2021-09-24 MED ORDER — FUROSEMIDE 40 MG PO TABS: 60.0000 mg | ORAL_TABLET | Freq: Every day | ORAL | 3 refills | Status: DC

## 2021-09-24 NOTE — Progress Notes (Signed)
Cardiology Office Note:    Date:  09/24/2021   ID:  Sue Andrews, DOB November 23, 1940, MRN [id number]  PCP:  Ameduite, Alvino Chapel, NP   [facility] Providers Cardiologist:  None     Referring MD: Ameduite, Alvino Chapel, NP   CC: establish care Consulted for the evaluation of AF at the behest of Ms. Ameduite  History of Present Illness:    Sue Andrews is a 81 y.o. 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  Patient notes that she is feeling well.   Has arthritis and chronic back problems but has no cardiac issues.  Has had no chest pain, chest pressure, chest tightness, chest stinging.   In [place] had a cardiac CT for AF, then had a heart catheterization and a PCI.  No shortness of breath, but has exertional DOE.  No PND or orthopnea.  No weight gain (intentional weight loss), notes chronic leg swelling since she was 14.  No syncope or near syncope. Notes no palpitations or funny heart beats ( is in AF RVR)  Has missed no eliquis in the last 30 days .   Past Medical History:  Diagnosis Date   Coronary artery disease    a.) s/p Synergy DES on 08/18/2019 in [place]   Hyperlipidemia    Hypertension    Hypothyroidism    PAF (paroxysmal atrial fibrillation) (HCC)    T2DM (type 2 diabetes mellitus) (HCC)     Past Surgical History:  Procedure Laterality Date   ABDOMINAL HYSTERECTOMY  1986   COLONOSCOPY     CORONARY ANGIOPLASTY WITH STENT PLACEMENT Right 08/18/2019   Synergy DES placed in [place]   HAMMER TOE SURGERY Left 2009   1st,2nd,3rd toes   TONSILLECTOMY      Current Medications: Current Meds  Medication Sig   aspirin 81 MG EC tablet Take 81 mg by mouth daily.   Calcium Carb-Cholecalciferol (CALCIUM 600+D3 PO) Take 1 tablet by mouth daily.   cyanocobalamin 1000 MCG tablet Take 1,000 mcg by mouth daily.   ELIQUIS 5 MG TABS tablet  Take 1 tablet (5 mg total) by mouth 2 (two) times daily.   furosemide (LASIX) 40 MG tablet Take 1.5 tablets (60 mg total) by mouth daily.   HUMALOG MIX 75/25 KWIKPEN (75-25) 100 UNIT/ML KwikPen Inject 40 Units into the skin 2 (two) times daily. Before breakfast and before dinner   levothyroxine (SYNTHROID) 25 MCG tablet Take 1 tablet (25 mcg total) by mouth daily.   losartan (COZAAR) 25 MG tablet Take 25 mg by mouth daily.   metoprolol succinate (TOPROL-XL) 100 MG 24 hr tablet Take 1 tablet (100 mg total) by mouth daily. Take with or immediately following a meal.   montelukast (SINGULAIR) 10 MG tablet Take 10 mg by mouth at bedtime.   Multiple Vitamins-Minerals (MULTIVITAMIN WITH MINERALS) tablet Take 1 tablet by mouth daily. Centrum Silver   rosuvastatin (CRESTOR) 5 MG tablet Take 1 tablet (5 mg total) by mouth daily.   [DISCONTINUED] furosemide (LASIX) 20 MG tablet Take 2 tablets (40 mg total) by mouth daily.   [DISCONTINUED] metoprolol tartrate (LOPRESSOR) 25 MG tablet Take 1 tablet (25 mg total) by mouth 2 (two) times daily.     Allergies:   Patient has no known allergies.   Social History   Socioeconomic History   Marital status: Widowed  Spouse name: Not on file   Number of children: 2   Years of education: Not on file   Highest education level: Not on file  Occupational History   Not on file  Tobacco Use   Smoking status: Never   Smokeless tobacco: Never  Vaping Use   Vaping Use: Never used  Substance and Sexual Activity   Alcohol use: Not Currently   Drug use: Never   Sexual activity: Not on file  Other Topics Concern   Not on file  Social History Narrative   Lives with son   Social Determinants of Health   Financial Resource Strain: Not on file  Food Insecurity: Not on file  Transportation Needs: Not on file  Physical Activity: Not on file  Stress: Not on file  Social Connections: Not on file     Family History: No family history of CAD, HF, AF  ROS:    Please see the history of present illness.     All other systems reviewed and are negative.  EKGs/Labs/Other Studies Reviewed:    The following studies were reviewed today:  EKG:  EKG is  ordered today.  The ekg ordered today demonstrates  09/24/21: AF RVR rate 133  Recent Labs: 04/18/2021: Hemoglobin 14.0; Platelets 167 07/23/2021: TSH 2.250 08/27/2021: ALT 26; BUN 19; Creatinine, Ser 0.80; Potassium 4.4; Sodium 139  Recent Lipid Panel    Component Value Date/Time   CHOL 152 08/27/2021 1333   TRIG 85 08/27/2021 1333   HDL 66 08/27/2021 1333   CHOLHDL 2.3 08/27/2021 1333   LDLCALC 70 08/27/2021 1333     Risk Assessment/Calculations:    CHA2DS2-VASc Score = 7   This indicates a 11.2% annual risk of stroke. The patient's score is based upon: CHF History: 1 HTN History: 1 Diabetes History: 1 Stroke History: 0 Vascular Disease History: 1 Age Score: 2 Gender Score: 1          Physical Exam:    VS:  BP 130/84   Pulse (!) 134   Ht 5\' 3"  (1.6 m)   Wt 210 lb 12.8 oz (95.6 kg)   SpO2 93%   BMI 37.34 kg/m     Wt Readings from Last 3 Encounters:  09/24/21 210 lb 12.8 oz (95.6 kg)  07/23/21 212 lb (96.2 kg)  04/18/21 220 lb (99.8 kg)     Gen: no distress, morbid obesity   Neck: No JVD Cardiac: No Rubs or Gallops, IRIR  with holsystolic murmur, tachycardia +2 radial pulses Respiratory: Clear to auscultation bilaterally, normal effort, normal  respiratory rate GI: Soft, nontender, non-distended  MS: +1 bilateral edema despite compression stockings;  moves all extremities Integument: Skin feels warm Neuro:  At time of evaluation, alert and oriented to person/place/time/situation  Psych: Normal affect, patient feels great   ASSESSMENT:    1. Atrial fibrillation, unspecified type (HCC)   2. Heart failure, unspecified HF chronicity, unspecified heart failure type (HCC)   3. Diabetes mellitus with coincident hypertension (HCC)   4. Hyperlipidemia associated with  type 2 diabetes mellitus (HCC)   5. HFrEF (heart failure with reduced ejection fraction) (HCC)   6. Morbid obesity (HCC)   7. 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 COPD HLD with DM HTN with DM - patient does  not remember the echo and feels well - we offered DCCV, deferred - we will increase lasix to 60 mg PO Daily(does not want BID dosing due to lifestyle) get BNP and BMP in 1-2 weeks (she used to work for [employer]) - will stop tartrate and increase to succinate 100 mg PO daily, she checks her vitals and notes she is never this high - continue losartan 25 - continue rosuvastatin 5 - will repeat echo as patient is not sure of the validity of this test; I suspect she will not wanted quad therapy for HF but we will see her in the fall if echo results are confirmed - working out more and had intentional weight loss      Medication Adjustments/Labs and Tests Ordered: Current medicines are reviewed at length with the patient today.  Concerns regarding medicines are outlined above.  Orders Placed This Encounter  Procedures   Basic metabolic panel   B Nat Peptide   EKG 12-Lead   ECHOCARDIOGRAM COMPLETE   Meds ordered this encounter  Medications   furosemide (LASIX) 40 MG tablet    Sig: Take 1.5 tablets (60 mg total) by mouth daily.    Dispense:  135 tablet    Refill:  3   metoprolol succinate (TOPROL-XL) 100 MG 24 hr tablet    Sig: Take 1 tablet (100 mg total) by mouth daily. Take with or immediately following a meal.    Dispense:  90 tablet    Refill:  3    Patient Instructions  Medication Instructions:  Your physician has recommended you make the following change in your medication:  Increase Lasix to 60 mg tablets daily Stop Metoprolol Tartrate  Start  Metoprolol Succinate 100 mg tablets daily   Labwork: In 1-2 weeks: BMP BNP  Testing/Procedures: Your physician has requested that you have an echocardiogram. Echocardiography is a painless test that uses sound waves to create images of your heart. It provides your doctor with information about the size and shape of your heart and how well your heart's chambers and valves are working. This procedure takes approximately one hour. There are no restrictions for this procedure.   Follow-Up: Follow up with Dr. Gasper Sells in the Fall.   Any Other Special Instructions Will Be Listed Below (If Applicable).     If you need a refill on your cardiac medications before your next appointment, please call your pharmacy.    Signed, Werner Lean, MD  09/24/2021 2:28 PM    [place]

## 2021-09-24 NOTE — Patient Instructions (Signed)
Medication Instructions:  Your physician has recommended you make the following change in your medication:  Increase Lasix to 60 mg tablets daily Stop Metoprolol Tartrate  Start Metoprolol Succinate 100 mg tablets daily   Labwork: In 1-2 weeks: BMP BNP  Testing/Procedures: Your physician has requested that you have an echocardiogram. Echocardiography is a painless test that uses sound waves to create images of your heart. It provides your doctor with information about the size and shape of your heart and how well your heart's chambers and valves are working. This procedure takes approximately one hour. There are no restrictions for this procedure.   Follow-Up: Follow up with Dr. Izora Ribas in the Fall.   Any Other Special Instructions Will Be Listed Below (If Applicable).     If you need a refill on your cardiac medications before your next appointment, please call your pharmacy.

## 2021-10-02 ENCOUNTER — Other Ambulatory Visit: Payer: Self-pay | Admitting: Nurse Practitioner

## 2021-10-02 ENCOUNTER — Encounter: Payer: Self-pay | Admitting: Internal Medicine

## 2021-10-09 ENCOUNTER — Ambulatory Visit (HOSPITAL_COMMUNITY)
Admission: RE | Admit: 2021-10-09 | Discharge: 2021-10-09 | Disposition: A | Discharge status: Home / Self Care | Payer: Medicare Other | Source: Ambulatory Visit | Attending: Internal Medicine | Admitting: Internal Medicine

## 2021-10-09 ENCOUNTER — Other Ambulatory Visit (HOSPITAL_COMMUNITY)
Admission: RE | Admit: 2021-10-09 | Discharge: 2021-10-09 | Disposition: A | Discharge status: Home / Self Care | Payer: Medicare Other | Source: Ambulatory Visit | Attending: Internal Medicine | Admitting: Internal Medicine

## 2021-10-09 DIAGNOSIS — I509 Heart failure, unspecified: Secondary | ICD-10-CM

## 2021-10-09 DIAGNOSIS — I4891 Unspecified atrial fibrillation: Secondary | ICD-10-CM

## 2021-10-09 LAB — ECHOCARDIOGRAM COMPLETE
AR max vel: 1.91 cm2
AV Area VTI: 1.9 cm2
AV Area mean vel: 1.89 cm2
AV Mean grad: 2.3 mmHg
AV Peak grad: 5.4 mmHg
Ao pk vel: 1.16 m/s
Area-P 1/2: 3.56 cm2
MV VTI: 1.43 cm2
S' Lateral: 2.6 cm

## 2021-10-09 LAB — BRAIN NATRIURETIC PEPTIDE: B Natriuretic Peptide: 335 pg/mL — ABNORMAL HIGH (ref 0.0–100.0)

## 2021-10-09 LAB — BASIC METABOLIC PANEL
Anion gap: 4 — ABNORMAL LOW (ref 5–15)
BUN: 22 mg/dL (ref 8–23)
CO2: 27 mmol/L (ref 22–32)
Calcium: 9 mg/dL (ref 8.9–10.3)
Chloride: 106 mmol/L (ref 98–111)
Creatinine, Ser: 0.57 mg/dL (ref 0.44–1.00)
GFR, Estimated: >60 mL/min (ref >60)
Glucose, Bld: 97 mg/dL (ref 70–99)
Potassium: 3.5 mmol/L (ref 3.5–5.1)
Sodium: 137 mmol/L (ref 135–145)

## 2021-10-22 ENCOUNTER — Ambulatory Visit (INDEPENDENT_AMBULATORY_CARE_PROVIDER_SITE_OTHER): Payer: Medicare Other | Admitting: Nurse Practitioner

## 2021-10-22 ENCOUNTER — Encounter: Payer: Self-pay | Admitting: Nurse Practitioner

## 2021-10-22 VITALS — BP 114/61 | HR 85 | Temp 97.3°F | Ht 63.0 in | Wt 210.2 lb

## 2021-10-22 DIAGNOSIS — E1142 Type 2 diabetes mellitus with diabetic polyneuropathy: Secondary | ICD-10-CM

## 2021-10-22 DIAGNOSIS — I10 Essential (primary) hypertension: Secondary | ICD-10-CM

## 2021-10-22 DIAGNOSIS — E039 Hypothyroidism, unspecified: Secondary | ICD-10-CM | POA: Diagnosis not present

## 2021-10-22 DIAGNOSIS — Z13820 Encounter for screening for osteoporosis: Secondary | ICD-10-CM | POA: Diagnosis not present

## 2021-10-22 DIAGNOSIS — E785 Hyperlipidemia, unspecified: Secondary | ICD-10-CM | POA: Diagnosis not present

## 2021-10-22 NOTE — Progress Notes (Signed)
Subjective:    Patient ID: Sue Andrews, female    DOB: [birth year], 81 y.o.   MRN: [id number]  HPI  Patient here for 3 month follow up on medications.  Controlled type 2 diabetes mellitus with diabetic polyneuropathy, without long-term current use of insulin ([place]) Patient states her blood sugar continues to be well controlled.  Last A1c was 5.5.  Patient has now decreased her insulin dosage from 40 units daily to 34 units daily.  Patient states that on average her blood sugars run 103-114 fasting and 124-145 postprandial.  Patient states that she does have bad days where her blood sugar can get as high as 170.  Patient does admit to having occasional lows.  Patient states that 1 time her blood sugar did get as low as 68 in the evening at a time where she had forgot to eat lunch.  Hypothyroidism, unspecified type Patient continues to take levothyroxine 25 mcg without difficulty.  Hyperlipidemia, unspecified hyperlipidemia type Last LDL was 70.  Patient has been taking Crestor 5 mg without difficulty.  Patient has no concerns  Primary hypertension Patient states that blood pressure continues to be well controlled.  Blood pressure today was 114/61.  Patient reports that blood pressures at home run around the 120s over 70s.  Patient denies any lightheadedness or dizziness.  Patient continues to take Lasix 40 mg without difficulty per day.  Patient also taking losartan 25 mg and metoprolol 100 mg.  Patient states that she saw cardiology for history of A-fib.  Patient states that cardiologist attempted to increase Lasix to 60 mg however she was unable to tolerating due to incontinence episodes.  Subsequently patient's Lasix was decreased back to 40 mg.  Patient to follow-up with cardiology as scheduled.  Patient denies any chest pain, shortness of breath, difficulty breathing.  Patient admits to having chronic leg swelling due to diagnosis of congestive heart failure.  Patient uses compression  stockings without difficulty to help manage swelling.    Patient has no other concerns.   Review of Systems  All other systems reviewed and are negative.      Objective:   Physical Exam Vitals reviewed.  Constitutional:      General: She is not in acute distress.    Appearance: Normal appearance. She is normal weight. She is not ill-appearing, toxic-appearing or diaphoretic.  HENT:     Head: Normocephalic and atraumatic.  Cardiovascular:     Rate and Rhythm: Normal rate and regular rhythm.     Pulses: Normal pulses.     Heart sounds: Normal heart sounds. No murmur heard. Pulmonary:     Effort: Pulmonary effort is normal. No respiratory distress.     Breath sounds: Normal breath sounds. No wheezing.  Abdominal:     General: Abdomen is flat. Bowel sounds are normal. There is no distension.     Palpations: Abdomen is soft. There is no mass.     Tenderness: There is no abdominal tenderness. There is no guarding or rebound.     Hernia: No hernia is present.  Musculoskeletal:     Right lower leg: No edema.     Left lower leg: No edema.     Comments: Grossly intact.  Patient ambulates with cane.  Nonpitting edema noted to bilateral legs.  Patient wearing compression stockings at time of exam.  Skin:    General: Skin is warm.     Capillary Refill: Capillary refill takes less than 2 seconds.  Neurological:  Mental Status: She is alert.     Comments: Grossly intact  Psychiatric:        Mood and Affect: Mood normal.        Behavior: Behavior normal.        Assessment & Plan:   1. Controlled type 2 diabetes mellitus with diabetic polyneuropathy, without long-term current use of insulin (HCC) -Discussed with patient the possibility of decreasing her insulin to 32 or 30 units a day however patient states that she would like to keep it at 34 units for now and discuss changing at a later date. -Discussed the risk of overly tight glycemic control patient stated  understanding -Remember to adjust insulin as needed if you are going to be skipping meals. - Hemoglobin A1c - CMP14+EGFR -Return to clinic 3 months  2. Hypothyroidism, unspecified type - TSH + free T4  3. Hyperlipidemia, unspecified hyperlipidemia type -Last LDL was 70 a month ago. -No need to recheck lipids at this time -Continue taking Crestor as prescribed -Follow-up in 3 months  4. Primary hypertension -Blood pressure today 114/61. -Continue taking Lasix as prescribed. - Continue taking losartan as prescribed -Continue taking metoprolol as prescribed -Discussed potassium supplementation.  Last potassium was 4.4.  We will continue to monitor potassium level to determine if potassium is needed. -Return to clinic in 2 months  5. Osteoporosis screening - DG BONE DENSITY (DXA)  6.  Swelling bilateral legs -Continue to use compressions stockings for swelling and elevate feet in the evening. -Due to patient not tolerating Lasix 60 mg well due to incontinence episodes do not recommend a increase in Lasix at this time.  We will continue to monitor if Lasix would be necessary in the future.    Note:  This document was prepared using Dragon voice recognition software and may include unintentional dictation errors. Note - This record has been created using [employer].  Chart creation errors have been sought, but may not always  have been located. Such creation errors do not reflect on  the standard of medical care.

## 2021-10-23 ENCOUNTER — Other Ambulatory Visit: Payer: Self-pay | Admitting: Nurse Practitioner

## 2021-10-23 LAB — CMP14+EGFR
ALT: 26 IU/L (ref 0–32)
AST: 33 IU/L (ref 0–40)
Albumin/Globulin Ratio: 1.7 (ref 1.2–2.2)
Albumin: 4.2 g/dL (ref 3.8–4.8)
Alkaline Phosphatase: 111 IU/L (ref 44–121)
BUN/Creatinine Ratio: 29 — ABNORMAL HIGH (ref 12–28)
BUN: 21 mg/dL (ref 8–27)
Bilirubin Total: 0.8 mg/dL (ref 0.0–1.2)
CO2: 30 mmol/L — ABNORMAL HIGH (ref 20–29)
Calcium: 9.8 mg/dL (ref 8.7–10.3)
Chloride: 101 mmol/L (ref 96–106)
Creatinine, Ser: 0.72 mg/dL (ref 0.57–1.00)
Globulin, Total: 2.5 g/dL (ref 1.5–4.5)
Glucose: 111 mg/dL — ABNORMAL HIGH (ref 70–99)
Potassium: 4.1 mmol/L (ref 3.5–5.2)
Sodium: 142 mmol/L (ref 134–144)
Total Protein: 6.7 g/dL (ref 6.0–8.5)
eGFR: 86 mL/min/{1.73_m2} (ref >59)

## 2021-10-23 LAB — HEMOGLOBIN A1C
Est. average glucose Bld gHb Est-mCnc: 123 mg/dL
Hgb A1c MFr Bld: 5.9 % — ABNORMAL HIGH (ref 4.8–5.6)

## 2021-10-23 LAB — TSH+FREE T4
Free T4: 1.18 ng/dL (ref 0.82–1.77)
TSH: 2.53 u[IU]/mL (ref 0.450–4.500)

## 2021-10-23 MED ORDER — PEN NEEDLES 32G X 4 MM MISC: 5 refills | Status: DC

## 2021-10-28 ENCOUNTER — Other Ambulatory Visit (HOSPITAL_COMMUNITY): Payer: Medicare Other

## 2021-10-30 ENCOUNTER — Other Ambulatory Visit: Payer: Self-pay | Admitting: Nurse Practitioner

## 2021-11-04 ENCOUNTER — Ambulatory Visit (HOSPITAL_COMMUNITY)
Admission: RE | Admit: 2021-11-04 | Discharge: 2021-11-04 | Disposition: A | Discharge status: Home / Self Care | Payer: Medicare Other | Source: Ambulatory Visit | Attending: Nurse Practitioner | Admitting: Nurse Practitioner

## 2021-11-04 DIAGNOSIS — Z794 Long term (current) use of insulin: Secondary | ICD-10-CM | POA: Diagnosis not present

## 2021-11-04 DIAGNOSIS — E119 Type 2 diabetes mellitus without complications: Secondary | ICD-10-CM | POA: Diagnosis not present

## 2021-11-04 DIAGNOSIS — Z780 Asymptomatic menopausal state: Secondary | ICD-10-CM | POA: Insufficient documentation

## 2021-11-04 DIAGNOSIS — Z13820 Encounter for screening for osteoporosis: Secondary | ICD-10-CM | POA: Diagnosis not present

## 2021-11-04 DIAGNOSIS — M85832 Other specified disorders of bone density and structure, left forearm: Secondary | ICD-10-CM | POA: Diagnosis not present

## 2021-11-26 ENCOUNTER — Ambulatory Visit (INDEPENDENT_AMBULATORY_CARE_PROVIDER_SITE_OTHER): Payer: Medicare Other | Admitting: Nurse Practitioner

## 2021-11-26 VITALS — BP 126/76 | HR 97 | Temp 97.5°F | Ht 63.0 in | Wt 210.2 lb

## 2021-11-26 DIAGNOSIS — M85832 Other specified disorders of bone density and structure, left forearm: Secondary | ICD-10-CM

## 2021-11-26 NOTE — Progress Notes (Signed)
   Subjective:    Patient ID: Sue Andrews, female    DOB: [birth year], 81 y.o.   MRN: [id number]  HPI  81 year old female patient presents to clinic to discuss DEXA scan results.  Bilateral hips and spine all within normal range.  Left forearm osteopenic.   Review of Systems  All other systems reviewed and are negative.      Objective:   Physical Exam Vitals reviewed.  Constitutional:      General: She is not in acute distress.    Appearance: Normal appearance. She is obese. She is not ill-appearing, toxic-appearing or diaphoretic.  HENT:     Head: Normocephalic and atraumatic.  Musculoskeletal:     Comments: Grossly at baseline.  Walks with cane.  Neurological:     Comments: Grossly intact  Psychiatric:        Mood and Affect: Mood normal.        Behavior: Behavior normal.           Assessment & Plan:   1. Osteopenia of left forearm -Discussed pros and cons of biphosphonate's in detail. -Patient slightly apprehensive about starting the medication. -Through shared decision making, decided to reevaluate the need for biphosphonate in 2 years after next DEXA scan.  If progression seen, will recommend starting a biphosphonate.  Patient in agreement.  Return to clinic for routine follow-up in October.  Call ahead of time for labs to be put in.    Note:  This document was prepared using Dragon voice recognition software and may include unintentional dictation errors. Note - This record has been created using [employer].  Chart creation errors have been sought, but may not always  have been located. Such creation errors do not reflect on  the standard of medical care.

## 2021-11-27 ENCOUNTER — Encounter: Payer: Self-pay | Admitting: Nurse Practitioner

## 2021-12-03 ENCOUNTER — Other Ambulatory Visit: Payer: Self-pay | Admitting: Nurse Practitioner

## 2021-12-03 NOTE — Progress Notes (Signed)
[facility] Quality Team Note  Name: Sue Andrews Date of Birth: 05-Nov-1940 MRN: [id number] Date: 12/03/2021  [facility] Quality Team has reviewed this patient's chart, please see recommendations below:  [facility] Quality Other; ((KED) Kidney Health Evaluation: Pt has half completed the EGFR lab but needs a microalbumin/creatinine ratio urine test in order to close this Gap. Pt has upcoming visit 01/22/2022)

## 2021-12-09 ENCOUNTER — Other Ambulatory Visit: Payer: Self-pay

## 2021-12-09 MED ORDER — METOPROLOL SUCCINATE ER 100 MG PO TB24: 100.0000 mg | ORAL_TABLET | Freq: Every day | ORAL | 3 refills | Status: DC

## 2021-12-24 ENCOUNTER — Other Ambulatory Visit: Payer: Self-pay | Admitting: *Deleted

## 2021-12-24 MED ORDER — ROSUVASTATIN CALCIUM 5 MG PO TABS: 5.0000 mg | ORAL_TABLET | Freq: Every day | ORAL | 1 refills | Status: DC

## 2021-12-24 MED ORDER — MONTELUKAST SODIUM 10 MG PO TABS: 10.0000 mg | ORAL_TABLET | Freq: Every day | ORAL | 1 refills | Status: DC

## 2021-12-24 MED ORDER — LOSARTAN POTASSIUM 25 MG PO TABS: 25.0000 mg | ORAL_TABLET | Freq: Every day | ORAL | 1 refills | Status: DC

## 2022-01-01 ENCOUNTER — Ambulatory Visit (INDEPENDENT_AMBULATORY_CARE_PROVIDER_SITE_OTHER): Payer: Medicare Other | Admitting: Nurse Practitioner

## 2022-01-01 VITALS — Ht 63.0 in | Wt 210.4 lb

## 2022-01-01 DIAGNOSIS — E785 Hyperlipidemia, unspecified: Secondary | ICD-10-CM

## 2022-01-01 DIAGNOSIS — Z0000 Encounter for general adult medical examination without abnormal findings: Secondary | ICD-10-CM

## 2022-01-01 DIAGNOSIS — E1142 Type 2 diabetes mellitus with diabetic polyneuropathy: Secondary | ICD-10-CM | POA: Diagnosis not present

## 2022-01-01 DIAGNOSIS — E039 Hypothyroidism, unspecified: Secondary | ICD-10-CM | POA: Diagnosis not present

## 2022-01-01 DIAGNOSIS — I10 Essential (primary) hypertension: Secondary | ICD-10-CM

## 2022-01-01 NOTE — Progress Notes (Addendum)
Subjective:    Patient ID: Sue Andrews, female    DOB: 1940/05/02, 81 y.o.   MRN: [id number]  HPI  AWV- Annual Wellness Visit  The patient was seen for their annual wellness visit. The patient's past medical history, surgical history, and family history were reviewed. Pertinent vaccines were reviewed ( tetanus, pneumonia, shingles, flu) The patient's medication list was reviewed and updated.  The height and weight were entered.  BMI recorded in electronic record elsewhere  Cognitive screening was completed. Outcome of Mini - Cog: pass   Falls /depression screening electronically recorded within record elsewhere  Current tobacco usage:never (All patients who use tobacco were given written and verbal information on quitting)  Recent listing of emergency department/hospitalizations over the past year were reviewed.  current specialist the patient sees on a regular basis: cardiologist once   Medicare annual wellness visit patient questionnaire was reviewed.  A written screening schedule for the patient for the next 5-10 years was given. Appropriate discussion of followup regarding next visit was discussed.     Review of Systems  All other systems reviewed and are negative.      Objective:   Physical Exam Vitals reviewed.  Constitutional:      General: She is not in acute distress.    Appearance: Normal appearance. She is normal weight. She is not ill-appearing, toxic-appearing or diaphoretic.  HENT:     Head: Normocephalic and atraumatic.  Cardiovascular:     Rate and Rhythm: Normal rate and regular rhythm.     Pulses: Normal pulses.     Heart sounds: Normal heart sounds. No murmur heard. Pulmonary:     Effort: Pulmonary effort is normal. No respiratory distress.     Breath sounds: Normal breath sounds. No wheezing.  Musculoskeletal:        General: Swelling present.     Comments: Grossly intact.  Swelling to bilateral legs and feet  Skin:    General: Skin  is warm.     Capillary Refill: Capillary refill takes less than 2 seconds.  Neurological:     Mental Status: She is alert.     Comments: Grossly intact  Psychiatric:        Mood and Affect: Mood normal.        Behavior: Behavior normal.           Assessment & Plan:   1. Wellness examination Adult wellness-complete.wellness physical was conducted today. Importance of diet and exercise were discussed in detail.  Importance of stress reduction and healthy living were discussed.  In addition to this a discussion regarding safety was also covered.  We also reviewed over immunizations and gave recommendations regarding current immunization needed for age.   In addition to this additional areas were also touched on including: Preventative health exams needed:  Colonoscopy not indicated Eye exam pending Shingrix- patient reports getting vax in 2021. Awaiting documentation Pneumonia vax- patient reports getting vax in 2021. Awaiting documentation  Patient was advised yearly wellness exam   2. Hyperlipidemia, unspecified hyperlipidemia type - Lipid Profile  3. Controlled type 2 diabetes mellitus with diabetic polyneuropathy, without long-term current use of insulin (HCC) - Urine Microalbumin w/creat. ratio - Hemoglobin A1c  4. Hypothyroidism, unspecified type - TSH + free T4  5. Primary hypertension - CMP14+EGFR  Patient to get labs done after 01/22/2022 for appointment around 01/26/2022    Note:  This document was prepared using Dragon voice recognition software and may include unintentional dictation errors. Note - This record  has been created using [employer].  Chart creation errors have been sought, but may not always  have been located. Such creation errors do not reflect on  the standard of medical care.

## 2022-01-03 ENCOUNTER — Encounter: Payer: Self-pay | Admitting: Nurse Practitioner

## 2022-01-07 ENCOUNTER — Encounter: Payer: Self-pay | Admitting: Nurse Practitioner

## 2022-01-13 ENCOUNTER — Other Ambulatory Visit: Payer: Self-pay | Admitting: Nurse Practitioner

## 2022-01-13 DIAGNOSIS — E119 Type 2 diabetes mellitus without complications: Secondary | ICD-10-CM | POA: Diagnosis not present

## 2022-01-22 ENCOUNTER — Ambulatory Visit: Payer: Medicare Other | Admitting: Nurse Practitioner

## 2022-01-23 DIAGNOSIS — E785 Hyperlipidemia, unspecified: Secondary | ICD-10-CM | POA: Diagnosis not present

## 2022-01-23 DIAGNOSIS — E039 Hypothyroidism, unspecified: Secondary | ICD-10-CM | POA: Diagnosis not present

## 2022-01-23 DIAGNOSIS — E1142 Type 2 diabetes mellitus with diabetic polyneuropathy: Secondary | ICD-10-CM | POA: Diagnosis not present

## 2022-01-23 DIAGNOSIS — I10 Essential (primary) hypertension: Secondary | ICD-10-CM | POA: Diagnosis not present

## 2022-01-24 LAB — CMP14+EGFR
ALT: 27 IU/L (ref 0–32)
AST: 37 IU/L (ref 0–40)
Albumin/Globulin Ratio: 1.6 (ref 1.2–2.2)
Albumin: 4.1 g/dL (ref 3.8–4.8)
Alkaline Phosphatase: 115 IU/L (ref 44–121)
BUN/Creatinine Ratio: 28 (ref 12–28)
BUN: 23 mg/dL (ref 8–27)
Bilirubin Total: 0.7 mg/dL (ref 0.0–1.2)
CO2: 28 mmol/L (ref 20–29)
Calcium: 9.6 mg/dL (ref 8.7–10.3)
Chloride: 95 mmol/L — ABNORMAL LOW (ref 96–106)
Creatinine, Ser: 0.81 mg/dL (ref 0.57–1.00)
Globulin, Total: 2.6 g/dL (ref 1.5–4.5)
Glucose: 231 mg/dL — ABNORMAL HIGH (ref 70–99)
Potassium: 3.7 mmol/L (ref 3.5–5.2)
Sodium: 139 mmol/L (ref 134–144)
Total Protein: 6.7 g/dL (ref 6.0–8.5)
eGFR: 74 mL/min/{1.73_m2} (ref >59)

## 2022-01-24 LAB — LIPID PANEL
Chol/HDL Ratio: 2.2 ratio (ref 0.0–4.4)
Cholesterol, Total: 156 mg/dL (ref 100–199)
HDL: 70 mg/dL (ref >39)
LDL Chol Calc (NIH): 63 mg/dL (ref 0–99)
Triglycerides: 135 mg/dL (ref 0–149)
VLDL Cholesterol Cal: 23 mg/dL (ref 5–40)

## 2022-01-24 LAB — TSH+FREE T4
Free T4: 1.28 ng/dL (ref 0.82–1.77)
TSH: 2.38 u[IU]/mL (ref 0.450–4.500)

## 2022-01-24 LAB — MICROALBUMIN / CREATININE URINE RATIO
Creatinine, Urine: 32.9 mg/dL
Microalb/Creat Ratio: 35 mg/g creat — ABNORMAL HIGH (ref 0–29)
Microalbumin, Urine: 11.5 ug/mL

## 2022-01-24 LAB — HEMOGLOBIN A1C
Est. average glucose Bld gHb Est-mCnc: 131 mg/dL
Hgb A1c MFr Bld: 6.2 % — ABNORMAL HIGH (ref 4.8–5.6)

## 2022-01-28 ENCOUNTER — Ambulatory Visit (INDEPENDENT_AMBULATORY_CARE_PROVIDER_SITE_OTHER): Payer: Medicare Other | Admitting: Nurse Practitioner

## 2022-01-28 VITALS — BP 122/82 | Ht 63.0 in | Wt 210.6 lb

## 2022-01-28 DIAGNOSIS — I10 Essential (primary) hypertension: Secondary | ICD-10-CM | POA: Diagnosis not present

## 2022-01-28 DIAGNOSIS — E1142 Type 2 diabetes mellitus with diabetic polyneuropathy: Secondary | ICD-10-CM | POA: Diagnosis not present

## 2022-01-28 NOTE — Progress Notes (Signed)
Subjective:    Patient ID: Sue Andrews, female    DOB: June 10, 1940, 81 y.o.   MRN: [id number]  Diabetes She presents for her follow-up diabetic visit. She has type 2 diabetes mellitus. Risk factors for coronary artery disease include diabetes mellitus and post-menopausal. Current diabetic treatment includes diet.   Discuss recent labs.  Patient reports blood pressures at home around 120s over 80s.  Patient states that sometimes she does feel lightheaded when she goes from sitting to standing.  Patient states that she is doing well with her diabetes regiment.  She is now taking 34 units of insulin daily.  This has been cut down from 40 units a day.  Patient denies any hypoglycemic events.  Patient reports blood sugars around 150 and as low as 90.   Review of Systems  Neurological:  Positive for light-headedness.  All other systems reviewed and are negative.      Objective:   Physical Exam Vitals reviewed.  Constitutional:      General: She is not in acute distress.    Appearance: Normal appearance. She is normal weight. She is not ill-appearing, toxic-appearing or diaphoretic.  HENT:     Head: Normocephalic and atraumatic.  Cardiovascular:     Rate and Rhythm: Normal rate. Rhythm irregular.     Pulses: Normal pulses.     Heart sounds: Normal heart sounds. No murmur heard.    Comments: A-fib Pulmonary:     Effort: Pulmonary effort is normal. No respiratory distress.     Breath sounds: Normal breath sounds. No wheezing.  Musculoskeletal:        General: Swelling present.     Right lower leg: Edema present.     Left lower leg: Edema present.     Comments: +2 pitting edema to bilateral legs  Skin:    General: Skin is warm.     Capillary Refill: Capillary refill takes less than 2 seconds.  Neurological:     Mental Status: She is alert.     Comments: Grossly intact  Psychiatric:        Mood and Affect: Mood normal.        Behavior: Behavior normal.            Assessment & Plan:   1. Primary hypertension -Blood pressure today 122/82.  Blood pressure at goal of less than 140/90 -Patient currently taking losartan 25 mg.  Patient may cut losartan down to 12.5 mg -Tight control of blood pressure not beneficial as we age -Goal for blood pressures 140/90. -Return to clinic in 3 months  2. Controlled type 2 diabetes mellitus with diabetic polyneuropathy, without long-term current use of insulin (HCC) -A1c 6.2.  Previous A1c 5.9. -Discussed that tight glycemic control not beneficial as we age.  Happy to see slightly higher A1c however would prefer A1c closer to 7.5 or 8. -Patient has decreased insulin from 40 units to now 34 units -Patient encouraged to decrease insulin again.  Through shared decision making agreed upon 32 units of insulin with patient monitoring blood sugar spikes over the holidays. -Return to clinic in 3 months  Microalbumin mildly increased at 35.  However in the setting of a normal creatinine and normal GFR, we will continue to monitor.  LDL currently 65.  Within goal of LDL less than 70.   Note:  This document was prepared using Dragon voice recognition software and may include unintentional dictation errors. Note - This record has been created using [employer].  Chart creation  errors have been sought, but may not always  have been located. Such creation errors do not reflect on  the standard of medical care.  -Patient currently taking 34 units of insulin

## 2022-01-31 ENCOUNTER — Encounter: Payer: Self-pay | Admitting: Nurse Practitioner

## 2022-02-11 NOTE — Addendum Note (Signed)
Addended by: Deneise Lever on: 02/11/2022 08:30 AM   Modules accepted: Level of Service

## 2022-02-27 ENCOUNTER — Other Ambulatory Visit: Payer: Self-pay | Admitting: Nurse Practitioner

## 2022-03-31 ENCOUNTER — Ambulatory Visit (INDEPENDENT_AMBULATORY_CARE_PROVIDER_SITE_OTHER): Payer: Medicare Other

## 2022-03-31 ENCOUNTER — Ambulatory Visit: Payer: Medicare Other | Admitting: Podiatry

## 2022-03-31 DIAGNOSIS — L84 Corns and callosities: Secondary | ICD-10-CM | POA: Diagnosis not present

## 2022-03-31 DIAGNOSIS — I10 Essential (primary) hypertension: Secondary | ICD-10-CM

## 2022-03-31 DIAGNOSIS — M2042 Other hammer toe(s) (acquired), left foot: Secondary | ICD-10-CM

## 2022-03-31 DIAGNOSIS — E119 Type 2 diabetes mellitus without complications: Secondary | ICD-10-CM

## 2022-03-31 NOTE — Patient Instructions (Addendum)
Look for urea 40% cream or ointment and apply to the thickened dry skin / calluses. This can be bought over the counter, at a pharmacy or online such as [employer].   More silicone pads can be purchased from:  [email or web address]  The pads we used today: silicone toe pad (slides over end of the toe) and toe crest (the loop with the cushion underneath)

## 2022-04-02 NOTE — Progress Notes (Signed)
  Subjective:  Patient ID: Sue Andrews, female    DOB: 12/09/1940,  MRN: [id number]  Chief Complaint  Patient presents with   Toe Pain    np hx of left toes broken many years ago now curling downward and causing severe pain    81 y.o. female presents with the above complaint. History confirmed with patient.   Objective:  Physical Exam: warm, good capillary refill, no trophic changes or ulcerative lesions, normal DP and PT pulses, normal monofilament exam, and normal sensory exam. Left Foot:  Semirigid hammertoe deformity left fourth toe with preulcerative callus tip of toe  Assessment:   1. Acquired hammertoe of left foot   2. Diabetes mellitus with coincident hypertension (HCC)   3. Pre-ulcerative calluses [L84]      Plan:  Patient was evaluated and treated and all questions answered.  Patient educated on diabetes. Discussed proper diabetic foot care and discussed risks and complications of disease. Educated patient in depth on reasons to return to the office immediately should he/she discover anything concerning or new on the feet. All questions answered. Discussed proper shoes as well.   All symptomatic hyperkeratoses were safely debrided with a sterile #15 blade to patient's level of comfort without incident. We discussed preventative and palliative care of these lesions including supportive and accommodative shoegear, padding, prefabricated and custom molded accommodative orthoses, use of a pumice stone and lotions/creams daily.  Dispensed offloading silicone pads and discussed using these.  Discussed the possibility of a flexor tenotomy if it does not improve.  Return in about 3 months (around 06/30/2022) for [facility] .

## 2022-04-03 ENCOUNTER — Encounter: Payer: Self-pay | Admitting: Family Medicine

## 2022-04-03 MED ORDER — LEVOTHYROXINE SODIUM 25 MCG PO TABS: 25.0000 ug | ORAL_TABLET | Freq: Every day | ORAL | 0 refills | Status: DC

## 2022-04-13 NOTE — Progress Notes (Unsigned)
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  Past Medical History    Past Medical History:  Diagnosis Date   Coronary artery disease    a.) s/p Synergy DES on 08/18/2019 in [place]   Hyperlipidemia    Hypertension    Hypothyroidism    PAF (paroxysmal atrial fibrillation) (HCC)    T2DM (type 2 diabetes mellitus) (HCC)    Past Surgical History:  Procedure Laterality Date   ABDOMINAL HYSTERECTOMY  1986   COLONOSCOPY     CORONARY ANGIOPLASTY WITH STENT PLACEMENT Right 08/18/2019   Synergy DES placed in [place]   HAMMER TOE SURGERY Left 2009   1st,2nd,3rd toes   TONSILLECTOMY      Allergies  No Known Allergies  History of Present Illness    Sue Andrews is a 82 year old female with previously mentioned complex past medical history of longstanding persistent atrial fibrillation, CAD status post PCI/DES with excellent anatomy, HFrEF with an LVEF of 30% with moderate MR and TR, COPD, hyperlipidemia, hypertension, and diabetes.  She had previously been followed by [facility] clinic.   She stated while she was in [place] she had a cardiac CT for atrial fibrillation.  She then underwent a heart catheterization and PCI with DES on 08/18/2019 unfortunately placement is unknown.  She was last seen in clinic on 09/24/2021 by Dr. Izora Ribas where overall she was doing fairly from a cardiac standpoint.  She did note intentional weight loss and chronic leg swelling.  She was continued on apixaban for CHA2DS2-VASc score of at least 7, she was offered DCCV and deferred, her furosemide was increased to 60 mg  p.o. daily with a BMP and a BNP 1 to 2 weeks after changing her medications.  Metoprolol tartrate was stopped and her metoprolol succinate was increased to 100 mg p.o. daily, and a repeat echocardiogram was ordered.  Echocardiogram revealed LVEF of 40-45%, mildly decreased function, that was likely due to to atrial fibrillation, left atrial size was severely dilated, right atrial size was severely dilated, mild to moderate mitral regurgitation, severe mitral annular calcification, aortic valve sclerosis is present without any evidence of aortic stenosis.  She returns to clinic today  Home Medications    Current Outpatient Medications  Medication Sig Dispense Refill   Aspirin 81 MG CAPS Take by mouth daily.     Calcium Carb-Cholecalciferol (CALCIUM 600+D3 PO) Take 1 tablet by mouth daily.     cyanocobalamin 1000 MCG tablet Take 1,000 mcg by mouth daily.     ELIQUIS 5 MG TABS tablet TAKE 1 TABLET BY MOUTH TWICE  DAILY 120 tablet 5   furosemide (LASIX) 40 MG tablet Take 1.5 tablets (60 mg total) by mouth daily. (Patient taking differently: Take 20 mg by mouth daily.) 135 tablet 3   HUMALOG MIX 75/25 KWIKPEN (75-25) 100 UNIT/ML KwikPen Inject 40 Units into the skin 2 (two) times daily. Before breakfast and before dinner     Insulin Pen Needle (PEN NEEDLES) 32G X 4 MM MISC Use as directed 50 each 5   levothyroxine (SYNTHROID) 25 MCG tablet Take 1 tablet (25 mcg total) by mouth daily. 90 tablet 0  losartan (COZAAR) 25 MG tablet TAKE 1 TABLET BY MOUTH DAILY 100 tablet 2   metoprolol succinate (TOPROL-XL) 100 MG 24 hr tablet Take 1 tablet (100 mg total) by mouth daily. Take with or immediately following a meal. 90 tablet 3   montelukast (SINGULAIR) 10 MG tablet TAKE 1 TABLET BY MOUTH AT  BEDTIME 100 tablet 2   Multiple Vitamins-Minerals (MULTIVITAMIN WITH MINERALS) tablet Take 1 tablet by mouth daily. Centrum Silver     rosuvastatin (CRESTOR) 5 MG tablet TAKE 1 TABLET BY MOUTH DAILY 100 tablet 2    triamcinolone (NASACORT) 55 MCG/ACT AERO nasal inhaler Place 1 spray into the nose daily as needed (allerg).     No current facility-administered medications for this visit.     Family History    No family history on file. has no family status information on file.   Social History    Social History   Socioeconomic History   Marital status: Widowed    Spouse name: Not on file   Number of children: 2   Years of education: Not on file   Highest education level: Not on file  Occupational History   Not on file  Tobacco Use   Smoking status: Never   Smokeless tobacco: Never  Vaping Use   Vaping Use: Never used  Substance and Sexual Activity   Alcohol use: Not Currently   Drug use: Never   Sexual activity: Not on file  Other Topics Concern   Not on file  Social History Narrative   Lives with son   Social Determinants of Health   Financial Resource Strain: Not on file  Food Insecurity: Not on file  Transportation Needs: Not on file  Physical Activity: Not on file  Stress: Not on file  Social Connections: Not on file  Intimate Partner Violence: Not on file     Review of Systems    General:  No chills, fever, night sweats or weight changes.  Cardiovascular:  No chest pain, dyspnea on exertion, edema, orthopnea, palpitations, paroxysmal nocturnal dyspnea. Dermatological: No rash, lesions/masses Respiratory: No cough, dyspnea Urologic: No hematuria, dysuria Abdominal:   No nausea, vomiting, diarrhea, bright red blood per rectum, melena, or hematemesis Neurologic:  No visual changes, wkns, changes in mental status. All other systems reviewed and are otherwise negative except as noted above.     Physical Exam    VS:  There were no vitals taken for this visit. , BMI There is no height or weight on file to calculate BMI.     GEN: Well nourished, well developed, in no acute distress. HEENT: normal. Neck: Supple, no JVD, carotid bruits, or masses. Cardiac: RRR, no  murmurs, rubs, or gallops. No clubbing, cyanosis, edema.  Radials 2+/PT 2+ and equal bilaterally.  Respiratory:  Respirations regular and unlabored, clear to auscultation bilaterally. GI: Soft, nontender, nondistended, BS + x 4. MS: no deformity or atrophy. Skin: warm and dry, no rash. Neuro:  Strength and sensation are intact. Psych: Normal affect.  Accessory Clinical Findings    ECG personally reviewed by me today- *** - No acute changes  Lab Results  Component Value Date   WBC 5.2 04/18/2021   HGB 14.0 04/18/2021   HCT 41.9 04/18/2021   MCV 96.8 04/18/2021   PLT 167 04/18/2021   Lab Results  Component Value Date   CREATININE 0.81 01/23/2022   BUN 23 01/23/2022   NA 139 01/23/2022   K 3.7 01/23/2022   CL 95 (L) 01/23/2022  CO2 28 01/23/2022   Lab Results  Component Value Date   ALT 27 01/23/2022   AST 37 01/23/2022   ALKPHOS 115 01/23/2022   BILITOT 0.7 01/23/2022   Lab Results  Component Value Date   CHOL 156 01/23/2022   HDL 70 01/23/2022   LDLCALC 63 01/23/2022   TRIG 135 01/23/2022   CHOLHDL 2.2 01/23/2022    Lab Results  Component Value Date   HGBA1C 6.2 (H) 01/23/2022    Assessment & Plan   1.  ***  Merryn Thaker, NP 04/13/2022, 8:49 PM

## 2022-04-15 ENCOUNTER — Encounter: Payer: Self-pay | Admitting: Physician Assistant

## 2022-04-15 ENCOUNTER — Ambulatory Visit: Payer: Medicare Other | Attending: Physician Assistant | Admitting: Cardiology

## 2022-04-15 VITALS — BP 130/80 | HR 117 | Ht 63.0 in | Wt 209.4 lb

## 2022-04-15 DIAGNOSIS — E785 Hyperlipidemia, unspecified: Secondary | ICD-10-CM

## 2022-04-15 DIAGNOSIS — E1169 Type 2 diabetes mellitus with other specified complication: Secondary | ICD-10-CM

## 2022-04-15 DIAGNOSIS — I5020 Unspecified systolic (congestive) heart failure: Secondary | ICD-10-CM | POA: Diagnosis not present

## 2022-04-15 DIAGNOSIS — I4891 Unspecified atrial fibrillation: Secondary | ICD-10-CM | POA: Diagnosis not present

## 2022-04-15 DIAGNOSIS — I10 Essential (primary) hypertension: Secondary | ICD-10-CM

## 2022-04-15 DIAGNOSIS — I25118 Atherosclerotic heart disease of native coronary artery with other forms of angina pectoris: Secondary | ICD-10-CM

## 2022-04-15 NOTE — Patient Instructions (Signed)
Medication Instructions:  Your physician recommends that you continue on your current medications as directed. Please refer to the Current Medication list given to you today.  *If you need a refill on your cardiac medications before your next appointment, please call your pharmacy*   Lab Work: NONE   If you have labs (blood work) drawn today and your tests are completely normal, you will receive your results only by: [place] (if you have MyChart) OR A paper copy in the mail If you have any lab test that is abnormal or we need to change your treatment, we will call you to review the results.   Testing/Procedures: NONE    Follow-Up: At [facility], you and your health needs are our priority.  As part of our continuing mission to provide you with exceptional heart care, we have created designated Provider Care Teams.  These Care Teams include your primary Cardiologist (physician) and Advanced Practice Providers (APPs -  Physician Assistants and Nurse Practitioners) who all work together to provide you with the care you need, when you need it.  We recommend signing up for the patient portal called "MyChart".  Sign up information is provided on this After Visit Summary.  MyChart is used to connect with patients for Virtual Visits (Telemedicine).  Patients are able to view lab/test results, encounter notes, upcoming appointments, etc.  Non-urgent messages can be sent to your provider as well.   To learn more about what you can do with MyChart, go to [email or web address].    Your next appointment:   6 month(s)  The format for your next appointment:   In Person  Provider:   You may see Dr. Dellia Cloud  or one of the following Advanced Practice Providers on your designated Care Team:   Bernerd Pho, PA-C  Ermalinda Barrios, PA-C     Other Instructions Thank you for choosing [facility]!    Important Information About Sugar

## 2022-04-22 DIAGNOSIS — E119 Type 2 diabetes mellitus without complications: Secondary | ICD-10-CM | POA: Diagnosis not present

## 2022-04-30 ENCOUNTER — Ambulatory Visit: Payer: Medicare Other | Admitting: Family Medicine

## 2022-05-26 ENCOUNTER — Ambulatory Visit (INDEPENDENT_AMBULATORY_CARE_PROVIDER_SITE_OTHER): Payer: Medicare Other | Admitting: Family Medicine

## 2022-05-26 VITALS — BP 128/70 | HR 99 | Temp 97.3°F | Ht 63.0 in | Wt 209.0 lb

## 2022-05-26 DIAGNOSIS — I10 Essential (primary) hypertension: Secondary | ICD-10-CM

## 2022-05-26 DIAGNOSIS — Z7901 Long term (current) use of anticoagulants: Secondary | ICD-10-CM | POA: Diagnosis not present

## 2022-05-26 DIAGNOSIS — I5020 Unspecified systolic (congestive) heart failure: Secondary | ICD-10-CM | POA: Diagnosis not present

## 2022-05-26 DIAGNOSIS — E785 Hyperlipidemia, unspecified: Secondary | ICD-10-CM | POA: Diagnosis not present

## 2022-05-26 DIAGNOSIS — E039 Hypothyroidism, unspecified: Secondary | ICD-10-CM

## 2022-05-26 DIAGNOSIS — E1169 Type 2 diabetes mellitus with other specified complication: Secondary | ICD-10-CM | POA: Diagnosis not present

## 2022-05-26 DIAGNOSIS — E119 Type 2 diabetes mellitus without complications: Secondary | ICD-10-CM

## 2022-05-26 DIAGNOSIS — E1142 Type 2 diabetes mellitus with diabetic polyneuropathy: Secondary | ICD-10-CM | POA: Diagnosis not present

## 2022-05-26 MED ORDER — FUROSEMIDE 20 MG PO TABS: 20.0000 mg | ORAL_TABLET | Freq: Every day | ORAL | 1 refills | Status: DC

## 2022-05-26 MED ORDER — PEN NEEDLES 32G X 4 MM MISC: 1 refills | Status: DC

## 2022-05-26 NOTE — Patient Instructions (Signed)
Labs today.  We will be in touch with the results.  Follow up in 6 months.

## 2022-05-27 DIAGNOSIS — I10 Essential (primary) hypertension: Secondary | ICD-10-CM | POA: Insufficient documentation

## 2022-05-27 DIAGNOSIS — E1142 Type 2 diabetes mellitus with diabetic polyneuropathy: Secondary | ICD-10-CM | POA: Insufficient documentation

## 2022-05-27 LAB — CMP14+EGFR
ALT: 27 IU/L (ref 0–32)
AST: 31 IU/L (ref 0–40)
Albumin/Globulin Ratio: 1.7 (ref 1.2–2.2)
Albumin: 4.2 g/dL (ref 3.8–4.8)
Alkaline Phosphatase: 111 IU/L (ref 44–121)
BUN/Creatinine Ratio: 30 — ABNORMAL HIGH (ref 12–28)
BUN: 26 mg/dL (ref 8–27)
Bilirubin Total: 0.7 mg/dL (ref 0.0–1.2)
CO2: 25 mmol/L (ref 20–29)
Calcium: 9.7 mg/dL (ref 8.7–10.3)
Chloride: 102 mmol/L (ref 96–106)
Creatinine, Ser: 0.88 mg/dL (ref 0.57–1.00)
Globulin, Total: 2.5 g/dL (ref 1.5–4.5)
Glucose: 82 mg/dL (ref 70–99)
Potassium: 4 mmol/L (ref 3.5–5.2)
Sodium: 141 mmol/L (ref 134–144)
Total Protein: 6.7 g/dL (ref 6.0–8.5)
eGFR: 67 mL/min/{1.73_m2} (ref >59)

## 2022-05-27 LAB — CBC
Hematocrit: 43 % (ref 34.0–46.6)
Hemoglobin: 14.6 g/dL (ref 11.1–15.9)
MCH: 32.7 pg (ref 26.6–33.0)
MCHC: 34 g/dL (ref 31.5–35.7)
MCV: 96 fL (ref 79–97)
Platelets: 166 10*3/uL (ref 150–450)
RBC: 4.47 x10E6/uL (ref 3.77–5.28)
RDW: 12.7 % (ref 11.7–15.4)
WBC: 5.7 10*3/uL (ref 3.4–10.8)

## 2022-05-27 LAB — LIPID PANEL
Chol/HDL Ratio: 2.1 ratio (ref 0.0–4.4)
Cholesterol, Total: 147 mg/dL (ref 100–199)
HDL: 70 mg/dL (ref >39)
LDL Chol Calc (NIH): 55 mg/dL (ref 0–99)
Triglycerides: 130 mg/dL (ref 0–149)
VLDL Cholesterol Cal: 22 mg/dL (ref 5–40)

## 2022-05-27 LAB — TSH: TSH: 2.01 u[IU]/mL (ref 0.450–4.500)

## 2022-05-27 LAB — HEMOGLOBIN A1C
Est. average glucose Bld gHb Est-mCnc: 126 mg/dL
Hgb A1c MFr Bld: 6 % — ABNORMAL HIGH (ref 4.8–5.6)

## 2022-05-27 NOTE — Progress Notes (Signed)
Subjective:  Patient ID: Sue Andrews, female    DOB: April 12, 1940  Age: 82 y.o. MRN: [id number]  CC: Chief Complaint  Patient presents with   Diabetes   Hypertension    AFIB   Arthritis    Right was out of place a couple of weeks ago happened over night , clicking out of place but has resolved now    HPI:  82 year old female with atrial fibrillation, HFrEF, type 2 diabetes, hypertension, coronary disease, hyperlipidemia presents for follow-up.  Patient states overall she is doing well.  Hypertension is well-controlled on Lasix, metoprolol, and losartan.  Needs refill on Lasix today.  Type 2 diabetes has been very well-controlled on Humalog 75/25.  She is currently on 34 units twice daily.  Has had some hypoglycemia.  Advised patient that we can discuss switching to a once daily insulin.  Hyperlipidemia has been well-controlled on Crestor.  Patient follows with cardiology regarding HFrEF, coronary disease, and atrial fibrillation.  Patient reports recent pain of her right thumb.  Now resolved.  Patient Active Problem List   Diagnosis Date Noted   Essential hypertension 05/27/2022   Controlled type 2 diabetes mellitus with diabetic polyneuropathy, without long-term current use of insulin ([place]) 05/27/2022   Atrial fibrillation ([place]) 09/24/2021   HFrEF (heart failure with reduced ejection fraction) ([place]) 09/24/2021   Hyperlipidemia associated with type 2 diabetes mellitus ([place]) 09/24/2021   Coronary artery disease of native artery of native heart with stable angina pectoris ([place]) 09/24/2021    Social Hx   Social History   Socioeconomic History   Marital status: Widowed    Spouse name: Not on file   Number of children: 2   Years of education: Not on file   Highest education level: Not on file  Occupational History   Not on file  Tobacco Use   Smoking status: Never   Smokeless tobacco: Never  Vaping Use   Vaping Use: Never used  Substance and Sexual Activity    Alcohol use: Not Currently   Drug use: Never   Sexual activity: Not on file  Other Topics Concern   Not on file  Social History Narrative   Lives with son   Social Determinants of Health   Financial Resource Strain: Not on file  Food Insecurity: Not on file  Transportation Needs: Not on file  Physical Activity: Not on file  Stress: Not on file  Social Connections: Not on file    Review of Systems Per HPI  Objective:  BP 128/70   Pulse 99   Temp (!) 97.3 F (36.3 C)   Ht 5' 3"$  (1.6 m)   Wt 209 lb (94.8 kg)   SpO2 96%   BMI 37.02 kg/m      05/26/2022    1:45 PM 04/15/2022    1:41 PM 01/28/2022    1:30 PM  BP/Weight  Systolic BP 0000000 [id number]  Diastolic BP 70 80 82  Wt. (Lbs) 209 209.4 210.6  BMI 37.02 kg/m2 37.09 kg/m2 37.31 kg/m2    Physical Exam Vitals and nursing note reviewed.  Constitutional:      General: She is not in acute distress.    Appearance: Normal appearance.  HENT:     Head: Normocephalic and atraumatic.  Eyes:     General:        Right eye: No discharge.        Left eye: No discharge.     Conjunctiva/sclera: Conjunctivae normal.  Cardiovascular:  Comments: Irregularly irregular. Pulmonary:     Effort: Pulmonary effort is normal.     Breath sounds: Normal breath sounds. No wheezing, rhonchi or rales.  Neurological:     Mental Status: She is alert.  Psychiatric:        Mood and Affect: Mood normal.        Behavior: Behavior normal.     Lab Results  Component Value Date   WBC 5.7 05/26/2022   HGB 14.6 05/26/2022   HCT 43.0 05/26/2022   PLT 166 05/26/2022   GLUCOSE 82 05/26/2022   CHOL 147 05/26/2022   TRIG 130 05/26/2022   HDL 70 05/26/2022   LDLCALC 55 05/26/2022   ALT 27 05/26/2022   AST 31 05/26/2022   NA 141 05/26/2022   K 4.0 05/26/2022   CL 102 05/26/2022   CREATININE 0.88 05/26/2022   BUN 26 05/26/2022   CO2 25 05/26/2022   TSH 2.010 05/26/2022   INR 1.2 04/18/2021   HGBA1C 6.0 (H) 05/26/2022      Assessment & Plan:   Problem List Items Addressed This Visit       Cardiovascular and Mediastinum   HFrEF (heart failure with reduced ejection fraction) ([place])    Euvolemic today.  Continue follow-up with cardiology.  Lasix refilled.      Relevant Medications   furosemide (LASIX) 20 MG tablet   Essential hypertension    Well-controlled.  Continue Lasix, metoprolol, and losartan.      Relevant Medications   furosemide (LASIX) 20 MG tablet     Endocrine   Hyperlipidemia associated with type 2 diabetes mellitus ([place])    Well-controlled.  Continue Crestor.      Relevant Medications   furosemide (LASIX) 20 MG tablet   Other Relevant Orders   Lipid panel (Completed)   Controlled type 2 diabetes mellitus with diabetic polyneuropathy, without long-term current use of insulin ([place]) - Primary    Well-controlled.  A1c 6.0.  Perhaps too well-controlled given her age.  Will reach out to patient to discuss switch to once daily insulin to help prevent hypoglycemia in the setting of 75/25.      Relevant Orders   CMP14+EGFR (Completed)   Hemoglobin A1c (Completed)   Other Visit Diagnoses     Hypothyroidism, unspecified type       Relevant Orders   TSH (Completed)   Chronic anticoagulation       Relevant Orders   CBC (Completed)       Meds ordered this encounter  Medications   furosemide (LASIX) 20 MG tablet    Sig: Take 1 tablet (20 mg total) by mouth daily.    Dispense:  90 tablet    Refill:  1   Insulin Pen Needle (PEN NEEDLES) 32G X 4 MM MISC    Sig: Use as directed    Dispense:  200 each    Refill:  1    Follow-up:  Return in about 6 months (around 11/24/2022).  [place]

## 2022-05-27 NOTE — Assessment & Plan Note (Signed)
Patient's type 2 diabetes is very well-controlled.  Perhaps too well-controlled given her age.  A1c currently 6.0.  We will reach out to the patient and see if she would like to go ahead and switch to a once daily insulin to help prevent hypoglycemia in the setting of 75/25.

## 2022-05-27 NOTE — Assessment & Plan Note (Signed)
Well-controlled.  A1c 6.0.  Perhaps too well-controlled given her age.  Will reach out to patient to discuss switch to once daily insulin to help prevent hypoglycemia in the setting of 75/25.

## 2022-05-27 NOTE — Assessment & Plan Note (Signed)
Well-controlled.  Continue Lasix, metoprolol, and losartan.

## 2022-05-27 NOTE — Assessment & Plan Note (Signed)
Euvolemic today.  Continue follow-up with cardiology.  Lasix refilled.

## 2022-05-27 NOTE — Assessment & Plan Note (Signed)
Well controlled. Continue Crestor. 

## 2022-05-28 ENCOUNTER — Other Ambulatory Visit: Payer: Self-pay | Admitting: Family Medicine

## 2022-05-28 DIAGNOSIS — E113393 Type 2 diabetes mellitus with moderate nonproliferative diabetic retinopathy without macular edema, bilateral: Secondary | ICD-10-CM | POA: Diagnosis not present

## 2022-05-28 LAB — HM DIABETES EYE EXAM

## 2022-05-28 MED ORDER — INSULIN GLARGINE 100 UNIT/ML SOLOSTAR PEN: 50.0000 [IU] | PEN_INJECTOR | Freq: Every day | SUBCUTANEOUS | 11 refills | Status: DC

## 2022-06-05 IMAGING — US US ABDOMEN LIMITED RUQ/ASCITES
1 series · 14 of 25 positions shown · non-contrast
Comparison: 05/20/2005

CLINICAL DATA: Elevated liver function tests

EXAM:
ULTRASOUND ABDOMEN LIMITED RIGHT UPPER QUADRANT

[Series 1: us abdomen limited ruq (liver/gb) · 14 of 39 slices shown]
[im 1/39]
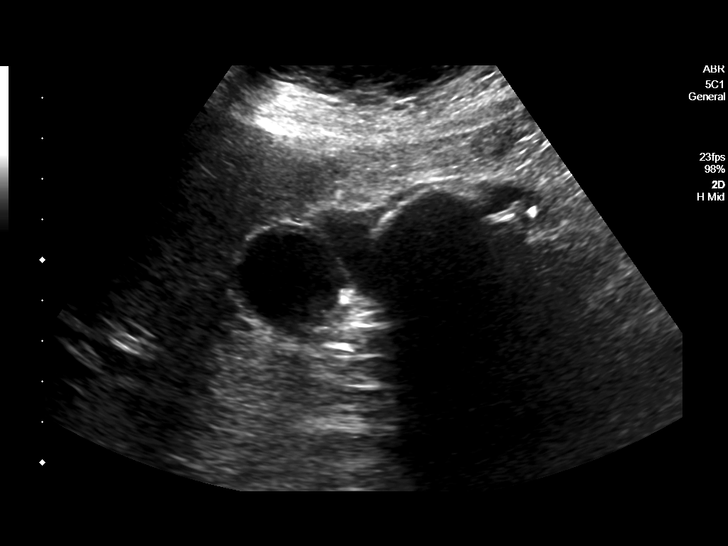
[im 4/39]
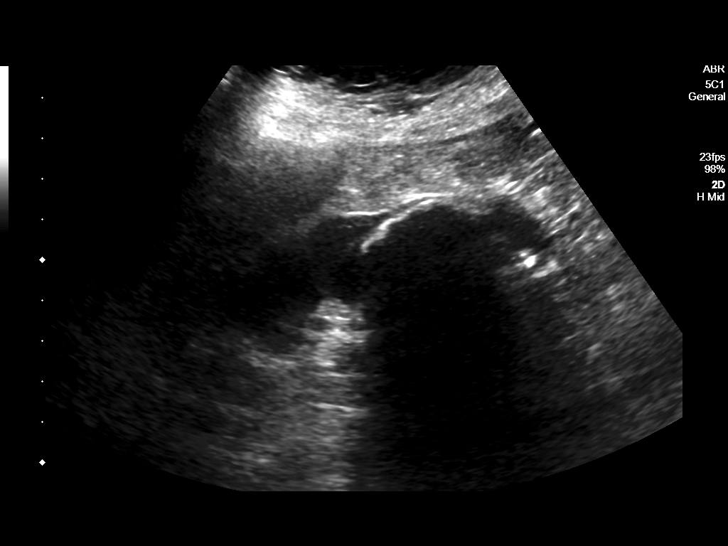
[im 7/39]
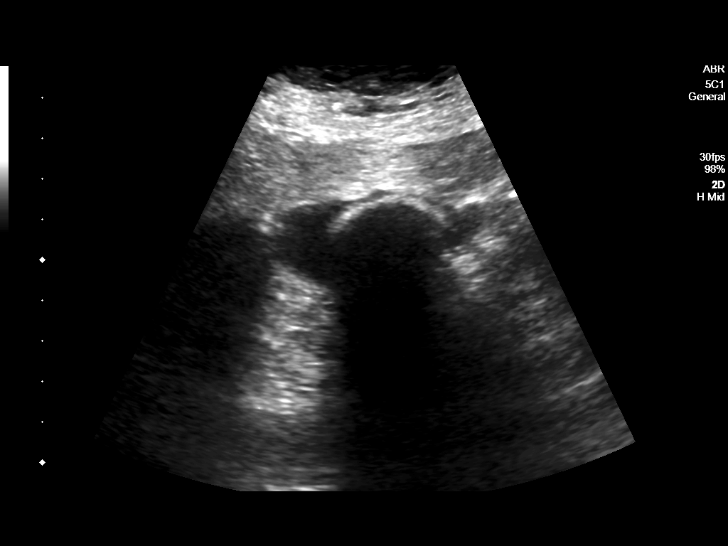
[im 10/39]
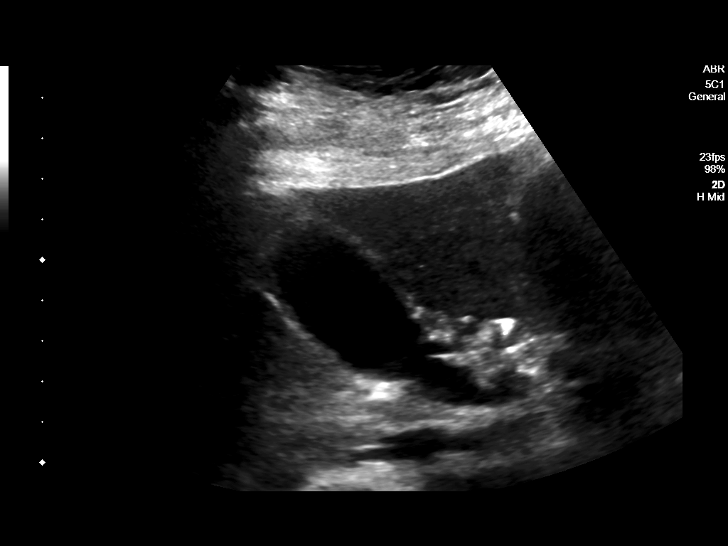
[im 13/39]
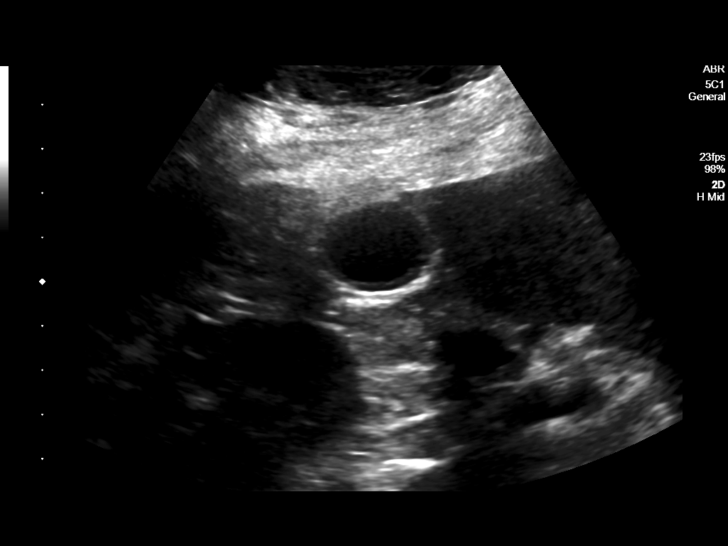
[im 15/39]
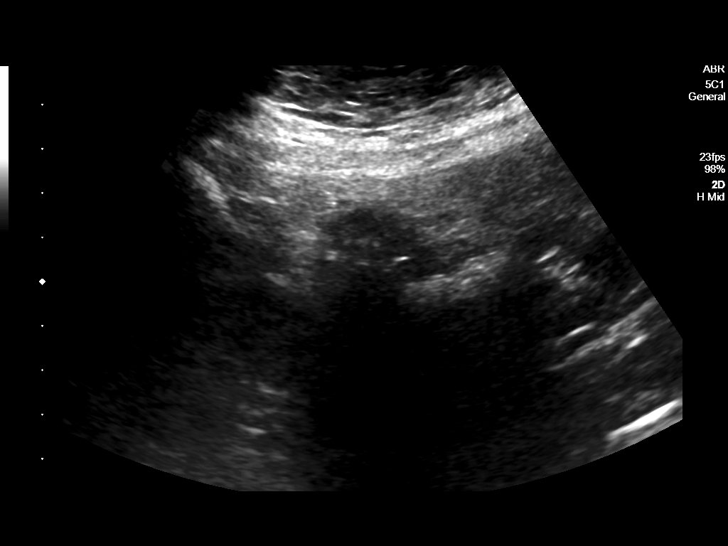
[im 18/39]
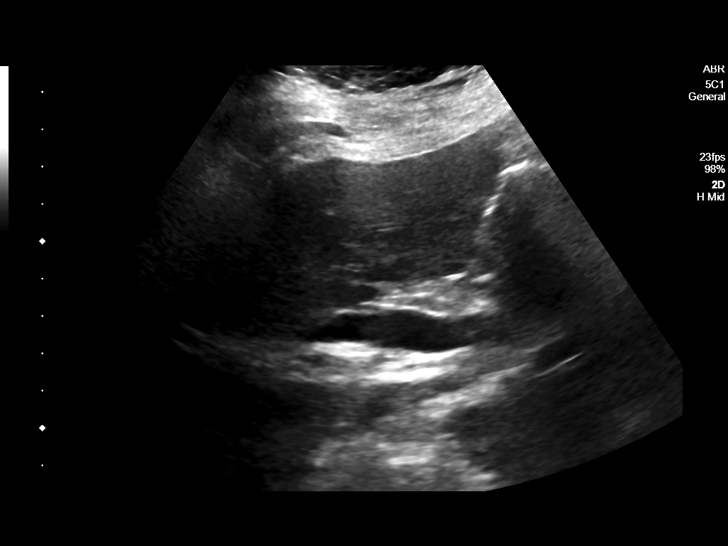
[im 21/39]
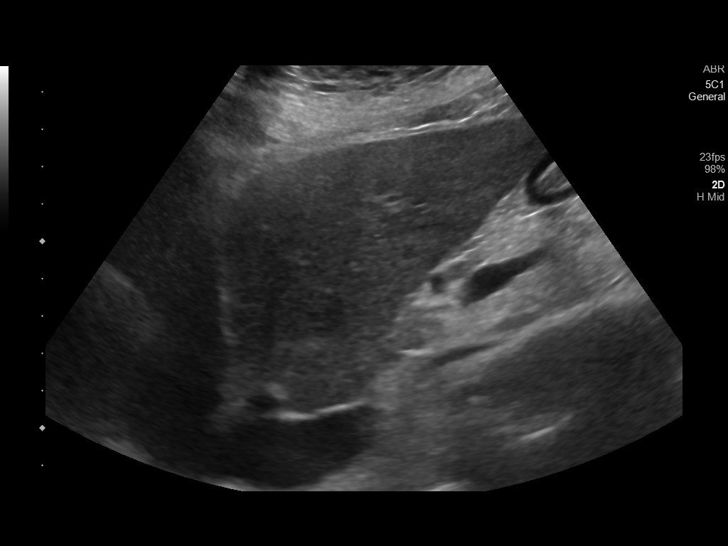
[im 24/39]
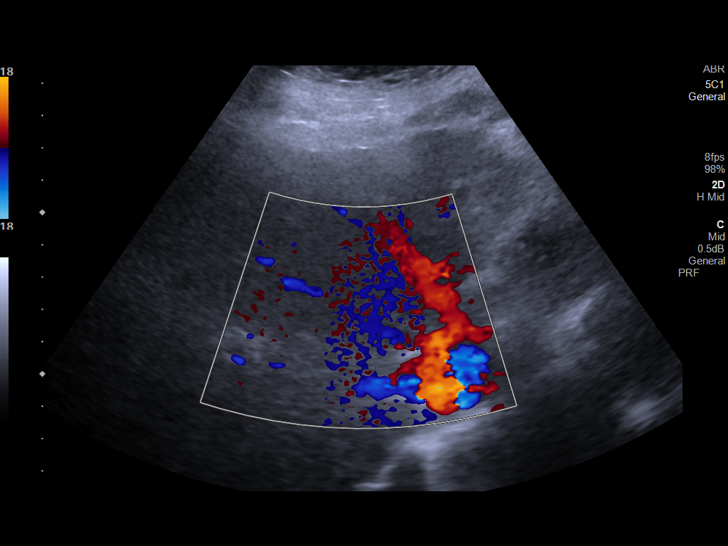
[im 26/39]
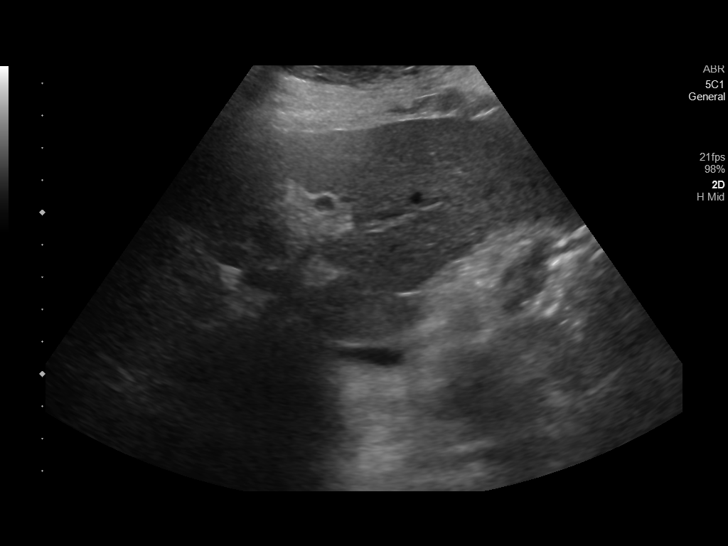
[im 29/39]
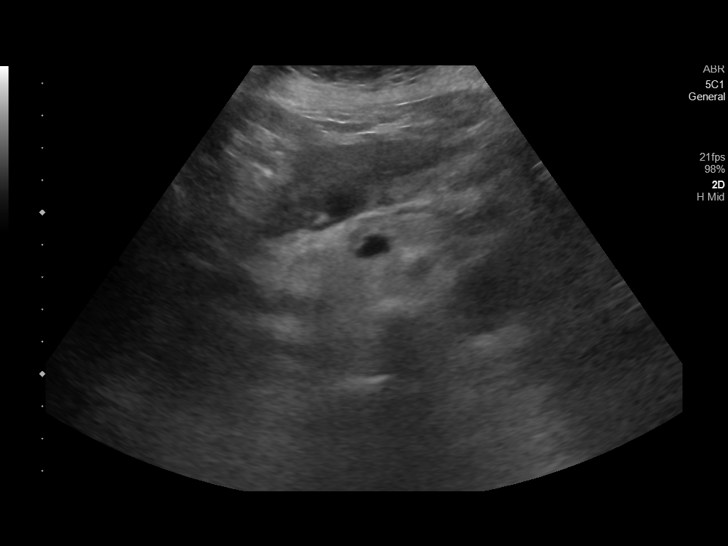
[im 32/39]
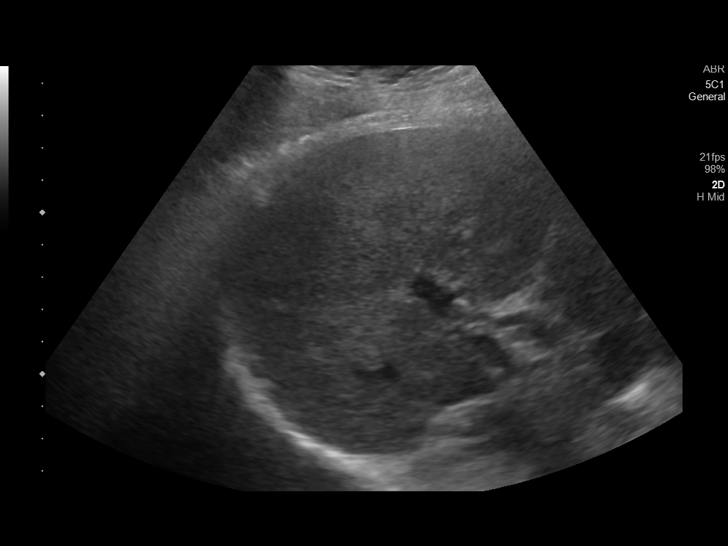
[im 35/39]
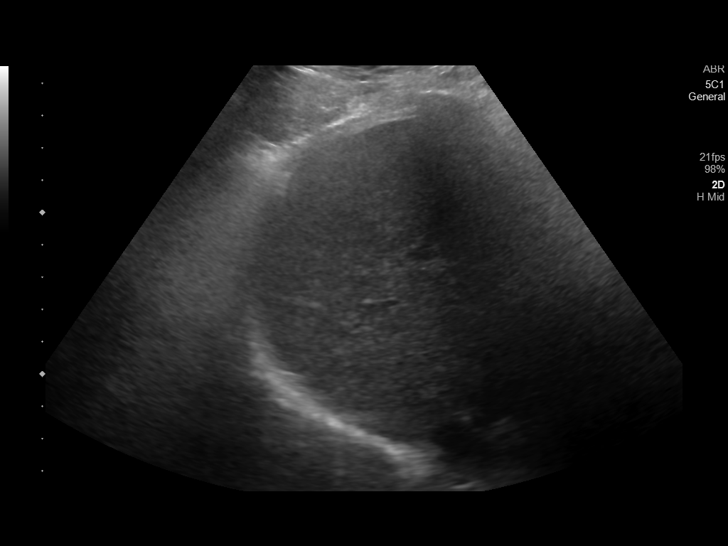
[im 39/39]
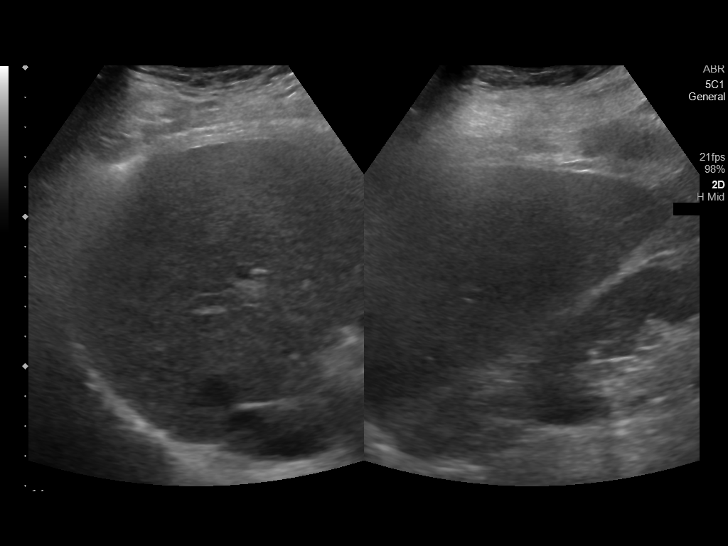

[14 of 25 positions shown; findings below may reference images not displayed]

FINDINGS: Gallbladder:

Multiple shadowing gallstones are identified, largest measuring
cm. No evidence of gallbladder wall thickening or pericholecystic
fluid. Negative sonographic Murphy sign.

Common bile duct:

Diameter: 8 mm

Liver:

No focal lesion identified. Within normal limits in parenchymal
echogenicity. Portal vein is patent on color Doppler imaging with
normal direction of blood flow towards the liver.

Other: None.
IMPRESSION: 1. Cholelithiasis without acute cholecystitis.

## 2022-06-30 ENCOUNTER — Ambulatory Visit: Payer: Medicare Other | Admitting: Podiatry

## 2022-07-03 ENCOUNTER — Encounter: Payer: Self-pay | Admitting: *Deleted

## 2022-07-17 ENCOUNTER — Encounter: Payer: Self-pay | Admitting: Podiatry

## 2022-07-17 ENCOUNTER — Ambulatory Visit: Payer: Medicare Other | Admitting: Podiatry

## 2022-07-17 DIAGNOSIS — M2042 Other hammer toe(s) (acquired), left foot: Secondary | ICD-10-CM | POA: Diagnosis not present

## 2022-07-17 DIAGNOSIS — L84 Corns and callosities: Secondary | ICD-10-CM | POA: Insufficient documentation

## 2022-07-17 NOTE — Progress Notes (Signed)
This patient presents to the office with improvement of the tip of her fourth toe left foot.  She says she has been wearing  a pad and crest pad on her left foot.  She says it has markedly improved since her initial visit.  She presents for evaluation and treatment.  Vascular  Dorsalis pedis and posterior tibial pulses are palpable  B/L.  Capillary return  WNL.  Temperature gradient is  WNL.  Skin turgor  WNL  Sensorium  Senn Weinstein monofilament wire  WNL. Normal tactile sensation.  Nail Exam  Patient has normal nails with no evidence of bacterial or fungal infection.  Orthopedic  Exam  Muscle tone and muscle strength  WNL.  No limitations of motion feet  B/L.  No crepitus or joint effusion noted.  Foot type is unremarkable .  Hammer toes  B/L. Bony prominences are unremarkable.  Skin  No open lesions.  Normal skin texture and turgor  Hammer toes fourth left foot.  ROV.  Discussed this condition as well as dispensed padding for her left foot.  RTC 3-4 months.   Helane Gunther DPM

## 2022-08-03 ENCOUNTER — Other Ambulatory Visit: Payer: Self-pay | Admitting: Family Medicine

## 2022-08-09 ENCOUNTER — Other Ambulatory Visit: Payer: Self-pay | Admitting: Internal Medicine

## 2022-08-09 ENCOUNTER — Other Ambulatory Visit: Payer: Self-pay | Admitting: Family Medicine

## 2022-08-10 ENCOUNTER — Other Ambulatory Visit: Payer: Self-pay

## 2022-08-10 MED ORDER — ELIQUIS 5 MG PO TABS: 5.0000 mg | ORAL_TABLET | Freq: Two times a day (BID) | ORAL | 1 refills | Status: DC

## 2022-08-10 NOTE — Telephone Encounter (Signed)
Prescription Request  08/10/2022  LOV: Visit date not found  What is the name of the medication or equipment? ELIQUIS 5 MG TABS tablet   Have you contacted your pharmacy to request a refill? Yes   Which pharmacy would you like this sent to? [facility]    Patient notified that their request is being sent to the clinical staff for review and that they should receive a response within 2 business days.   Please advise at Mobile There is no such number on file (mobile).

## 2022-08-14 ENCOUNTER — Encounter: Payer: Self-pay | Admitting: Family Medicine

## 2022-08-14 MED ORDER — FUROSEMIDE 20 MG PO TABS: 20.0000 mg | ORAL_TABLET | Freq: Every day | ORAL | 1 refills | Status: DC

## 2022-08-27 DIAGNOSIS — E119 Type 2 diabetes mellitus without complications: Secondary | ICD-10-CM | POA: Diagnosis not present

## 2022-09-24 ENCOUNTER — Encounter: Payer: Self-pay | Admitting: Student

## 2022-09-24 ENCOUNTER — Ambulatory Visit: Payer: Medicare Other | Attending: Student | Admitting: Student

## 2022-09-24 VITALS — BP 120/72 | HR 96 | Ht 63.0 in | Wt 208.6 lb

## 2022-09-24 DIAGNOSIS — I10 Essential (primary) hypertension: Secondary | ICD-10-CM | POA: Diagnosis not present

## 2022-09-24 DIAGNOSIS — I5020 Unspecified systolic (congestive) heart failure: Secondary | ICD-10-CM | POA: Diagnosis not present

## 2022-09-24 DIAGNOSIS — I340 Nonrheumatic mitral (valve) insufficiency: Secondary | ICD-10-CM | POA: Diagnosis not present

## 2022-09-24 DIAGNOSIS — I4811 Longstanding persistent atrial fibrillation: Secondary | ICD-10-CM | POA: Diagnosis not present

## 2022-09-24 DIAGNOSIS — E785 Hyperlipidemia, unspecified: Secondary | ICD-10-CM

## 2022-09-24 DIAGNOSIS — I2510 Atherosclerotic heart disease of native coronary artery without angina pectoris: Secondary | ICD-10-CM | POA: Diagnosis not present

## 2022-09-24 NOTE — Patient Instructions (Signed)
Medication Instructions:  Your physician recommends that you continue on your current medications as directed. Please refer to the Current Medication list given to you today.  *If you need a refill on your cardiac medications before your next appointment, please call your pharmacy*   Lab Work: NONE   If you have labs (blood work) drawn today and your tests are completely normal, you will receive your results only by: MyChart Message (if you have MyChart) OR A paper copy in the mail If you have any lab test that is abnormal or we need to change your treatment, we will call you to review the results.   Testing/Procedures: Your physician has requested that you have an echocardiogram. Echocardiography is a painless test that uses sound waves to create images of your heart. It provides your doctor with information about the size and shape of your heart and how well your heart's chambers and valves are working. This procedure takes approximately one hour. There are no restrictions for this procedure. Please do NOT wear cologne, perfume, aftershave, or lotions (deodorant is allowed). Please arrive 15 minutes prior to your appointment time.    Follow-Up: At [facility], you and your health needs are our priority.  As part of our continuing mission to provide you with exceptional heart care, we have created designated Provider Care Teams.  These Care Teams include your primary Cardiologist (physician) and Advanced Practice Providers (APPs -  Physician Assistants and Nurse Practitioners) who all work together to provide you with the care you need, when you need it.  We recommend signing up for the patient portal called "MyChart".  Sign up information is provided on this After Visit Summary.  MyChart is used to connect with patients for Virtual Visits (Telemedicine).  Patients are able to view lab/test results, encounter notes, upcoming appointments, etc.  Non-urgent messages can be sent to  your provider as well.   To learn more about what you can do with MyChart, go to [email or web address].    Your next appointment:   6 month(s)  Provider:   Luane School, MD or Randall An, PA-C    Other Instructions Thank you for choosing [facility]!

## 2022-09-24 NOTE — Progress Notes (Signed)
Cardiology Office Note    Date:  09/24/2022  ID:  Sue Andrews, DOB [birth year], MRN [id number] Cardiologist: Previously evaluated by Dr. Izora Ribas --> prefers to follow-up in [facility]  History of Present Illness:    Sue Andrews is a 82 y.o. female with past medical history of CAD (s/p prior DES in 2021 in [place] with full details unavailable), HFrEF (EF 30% in 2021, at 40 to 45% in 10/2021), persistent atrial fibrillation, mitral regurgitation, COPD, HTN, HLD and Type 2 DM who presents to the office today for 45-month follow-up.   She was examined by Charlsie Quest, NP in 04/2022 and reported overall doing well at that time and denied any recent anginal symptoms or palpitations. She had previously declined DCCV for atrial fibrillation and was continued on medical therapy with Eliquis for anticoagulation along with Toprol-XL 100 mg daily for rate-control. Medication titration was reviewed in regards to GDMT but she wished to continue her current regimen at that time.  In talking with the patient today, she reports overall doing well since her last office visit. She does live with her son but says she performs most of the chores around the house and denies any recent chest pain or dyspnea on exertion with these activities. She also goes to the grocery store and denies any symptoms with this. No recent orthopnea, PND or worsening lower extremity edema. She reports having chronic edema since she was a teenager but previously did not tolerate higher doses of diuretic therapy due to frequent urination.  Studies Reviewed:   EKG: EKG is not ordered today.  Echocardiogram: 10/2021 IMPRESSIONS     1. Left ventricular ejection fraction, by estimation, is 40 to 45%. The  left ventricle has mildly decreased function. The left ventricle  demonstrates global hypokinesis. There is mild concentric left ventricular  hypertrophy. Diastolic function  indeterminant due to Afib.   2. Right  ventricular systolic function is mildly reduced. The right  ventricular size is normal. There is normal pulmonary artery systolic  pressure.   3. Left atrial size was severely dilated.   4. Right atrial size was mildly dilated.   5. The mitral valve is abnormal. Mild to moderate mitral valve  regurgitation. Severe mitral annular calcification.   6. The aortic valve is tricuspid. There is mild calcification of the  aortic valve. There is mild thickening of the aortic valve. Aortic valve  regurgitation is not visualized. Aortic valve sclerosis/calcification is  present, without any evidence of  aortic stenosis.   7. The inferior vena cava is normal in size with greater than 50%  respiratory variability, suggesting right atrial pressure of 3 mmHg.   Comparison(s): No prior Echocardiogram.    Risk Assessment/Calculations:    CHA2DS2-VASc Score = 7   This indicates a 11.2% annual risk of stroke. The patient's score is based upon: CHF History: 1 HTN History: 1 Diabetes History: 1 Stroke History: 0 Vascular Disease History: 1 Age Score: 2 Gender Score: 1     Physical Exam:   VS:  BP 120/72   Pulse 96   Ht 5\' 3"  (1.6 m)   Wt 208 lb 9.6 oz (94.6 kg)   SpO2 95%   BMI 36.95 kg/m    Wt Readings from Last 3 Encounters:  09/24/22 208 lb 9.6 oz (94.6 kg)  05/26/22 209 lb (94.8 kg)  04/15/22 209 lb 6.4 oz (95 kg)     GEN: Pleasant, elderly female appearing in no acute distress NECK: No JVD; No carotid  bruits CARDIAC: Irregularly irregular, 2/6 systolic murmur along Apex.  RESPIRATORY:  Clear to auscultation without rales, wheezing or rhonchi  ABDOMEN: Appears non-distended. No obvious abdominal masses. EXTREMITIES: No clubbing or cyanosis. Chronic appearing pitting edema.  Distal pedal pulses are 2+ bilaterally.   Assessment and Plan:   1. CAD - She underwent DES placement in 2021 while in [place] with full details unavailable.  Thankfully, she is overall doing well and  denies any recent anginal symptoms. - Remains on ASA 81 mg daily (has been on this along with Eliquis), Toprol-XL 100 mg daily and Crestor 5 mg daily.  2. HFrEF  - Her EF was previously at 30% in 2021, at 40 to 45% in 10/2021. She does have chronic appearing edema on examination which is likely due to a component of lymphedema but says her weight has been stable and denies any recent respiratory issues. We discussed possible titration of medical therapy including switching Losartan to Entresto or adding an SGLT2 inhibitor but she prefers to continue her current medications given that she is overall doing well.  Will plan to obtain a follow-up echocardiogram prior to her next visit. If her EF has declined, she is possibly open to medication changes at that time. She has preferred to remain on Lasix 20 mg daily as she experienced very frequent urination with higher doses. Therefore, we will continue this along with Losartan 25 mg daily and Toprol-XL 100 mg daily.  3. Persistent Atrial Fibrillation - She denies any recent palpitations and her heart rate is in the 80's to 90's during today's visit and has been well-controlled at home in the 70's to 80's by review of her BP/HR log. Previously declined DCCV and rate-control has been pursued.  - No reports of active bleeding. Continue Eliquis 5mg  BID for anticoagulation which is the appropriate dose given her age, weight and renal function. Labs in 05/2022 showed hemoglobin was stable at 14.6 with platelets at 166 K.   4. Mitral Regurgitation - This was mild to moderate by most recent imaging in 10/2021. Will plan for a follow-up echocardiogram prior to her next office visit.  5. HTN - Blood pressure is well-controlled at 120/72 during today's visit. Continue current medical therapy with Losartan 25 mg daily and Toprol-XL 100 mg daily.  6. HLD - Followed by her PCP and most recent labs in 05/2022 showed her LDL was well-controlled at 55. Continue current  medical therapy with Crestor 5 mg daily as she was previously intolerant to higher intensity statin therapy.   Signed, Ellsworth Lennox, PA-C

## 2022-10-02 ENCOUNTER — Ambulatory Visit: Payer: Medicare Other | Admitting: Podiatry

## 2022-10-02 DIAGNOSIS — E119 Type 2 diabetes mellitus without complications: Secondary | ICD-10-CM

## 2022-10-02 DIAGNOSIS — M2042 Other hammer toe(s) (acquired), left foot: Secondary | ICD-10-CM | POA: Diagnosis not present

## 2022-10-02 DIAGNOSIS — I10 Essential (primary) hypertension: Secondary | ICD-10-CM

## 2022-10-03 ENCOUNTER — Encounter: Payer: Self-pay | Admitting: Podiatry

## 2022-10-03 NOTE — Progress Notes (Signed)
This patient presents to the office with her son to state her 3,4 toes on her left foot continues to be painfl.  She has worn a crest pad and toe cap but problem persists.  She is concerned that her fourth toe is so painful she may fall.  She desires definitive treatment for correction of this condition.  Patient has history of diabetes.  General Appearance  Alert, conversant and in no acute stress.  Vascular  Dorsalis pedis and posterior tibial  pulses are palpable  bilaterally.  Capillary return is within normal limits  bilaterally. Temperature is within normal limits  bilaterally.  Neurologic  Senn-Weinstein monofilament wire test within normal limits  bilaterally. Muscle power within normal limits bilaterally.  Nails Thick disfigured discolored nails with subungual debris  from hallux to fifth toes bilaterally. No evidence of bacterial infection or drainage bilaterally.  Orthopedic  No limitations of motion  feet .  No crepitus or effusions noted.  Hammer toes 3,4 left foot  Skin  normotropic skin with no porokeratosis noted bilaterally.  No signs of infections or ulcers noted.     Hammer toes  3,4 left foot.  ROV.  Discussed conservative vs. Surgical correction.  She desires permanent correction.  Patient was referred to Dr.  Allena Katz for treatment.   Helane Gunther DPM

## 2022-10-19 ENCOUNTER — Other Ambulatory Visit: Payer: Self-pay | Admitting: Family Medicine

## 2022-10-28 ENCOUNTER — Ambulatory Visit: Payer: Medicare Other | Admitting: Podiatry

## 2022-10-28 DIAGNOSIS — M205X2 Other deformities of toe(s) (acquired), left foot: Secondary | ICD-10-CM

## 2022-10-28 NOTE — Progress Notes (Signed)
  Subjective:  Patient ID: Sue Andrews, female    DOB: [birth year],  MRN: [id number]  Chief Complaint  Patient presents with   Nail Problem    Left 2nd /3rd toes are rubbing against each other   82 y.o. female returns today for planned flexor tenotomy of the fourth digit.  Objective:  There were no vitals filed for this visit.  General AA&O x3. Normal mood and affect.  Vascular Pedal pulses palpable.  Neurologic Epicritic sensation grossly intact.  Dermatologic Pre-ulcerative callus at the tip of the left, 4th toe  Orthopedic: Semi-reducible hammertoe deformity left, 4th toe    Assessment & Plan:  Patient was evaluated and treated and all questions answered.  Hammertoe left fourth with pre-ulcerative callus -Flexor tenotomy as below. -Advised to remove the dressing in 24 hours and apply a band-aid and triple abx ointment every day thereafter.  Procedure: Flexor Tenotomy Indication for Procedure: toe with semi-reducible hammertoe with distal tip ulceration. Flexor tenotomy indicated to alleviate contracture, reduce pressure, and enhance healing of the ulceration. Location: left, 4th toe Anesthesia: Lidocaine 1% plain; 1.5 mL and Marcaine 0.5% plain; 1.5 mL digital block Instrumentation: 18 g needle  Technique: The toe was anesthetized as above and prepped in the usual fashion. The toe was exsanquinated and a tourniquet was secured at the base of the toe. An 18g needle was then used to percutaneously release the flexor tendon at the plantar surface of the toe with noted release of the hammertoe deformity. The incision was then dressed with antibiotic ointment and band-aid. Compression splint dressing applied. Patient tolerated the procedure well. Dressing: Dry, sterile, compression dressing. Disposition: Patient tolerated procedure well. Patient to return in 1 week for follow-up.      No follow-ups on file.

## 2022-11-11 ENCOUNTER — Ambulatory Visit: Payer: Medicare Other | Admitting: Podiatry

## 2022-11-11 DIAGNOSIS — M205X2 Other deformities of toe(s) (acquired), left foot: Secondary | ICD-10-CM

## 2022-11-11 NOTE — Progress Notes (Signed)
  Subjective:  Patient ID: Sue Andrews, female    DOB: [birth year],  MRN: [id number]  Chief Complaint  Patient presents with   Toe contracture, left   82 y.o. female returns today for planned flexor tenotomy of the fourth digit.  Objective:  There were no vitals filed for this visit.  General AA&O x3. Normal mood and affect.  Vascular Pedal pulses palpable.  Neurologic Epicritic sensation grossly intact.  Dermatologic No further pre-ulcerative callus at the tip of the left, 4th toe  Orthopedic: No further semi-reducible hammertoe deformity left, 4th toe    Assessment & Plan:  Patient was evaluated and treated and all questions answered.  Hammertoe left fourth with pre-ulcerative callus -Clinically healed and officially discharged from my care.  Patient has a rectus toe alignment and has reduced pain to the distal tip of the toe.  If any further increases in future shows come back and see me      No follow-ups on file.

## 2022-11-24 ENCOUNTER — Ambulatory Visit: Payer: Medicare Other | Admitting: Family Medicine

## 2022-11-24 DIAGNOSIS — E785 Hyperlipidemia, unspecified: Secondary | ICD-10-CM | POA: Diagnosis not present

## 2022-11-24 DIAGNOSIS — R2689 Other abnormalities of gait and mobility: Secondary | ICD-10-CM | POA: Diagnosis not present

## 2022-11-24 DIAGNOSIS — I5020 Unspecified systolic (congestive) heart failure: Secondary | ICD-10-CM

## 2022-11-24 DIAGNOSIS — I4891 Unspecified atrial fibrillation: Secondary | ICD-10-CM | POA: Diagnosis not present

## 2022-11-24 DIAGNOSIS — E1142 Type 2 diabetes mellitus with diabetic polyneuropathy: Secondary | ICD-10-CM

## 2022-11-24 DIAGNOSIS — E1169 Type 2 diabetes mellitus with other specified complication: Secondary | ICD-10-CM | POA: Diagnosis not present

## 2022-11-24 DIAGNOSIS — Z794 Long term (current) use of insulin: Secondary | ICD-10-CM

## 2022-11-24 DIAGNOSIS — I10 Essential (primary) hypertension: Secondary | ICD-10-CM

## 2022-11-24 NOTE — Assessment & Plan Note (Signed)
-   Continue Eliquis and metoprolol 

## 2022-11-24 NOTE — Assessment & Plan Note (Signed)
Echo scheduled for December.  If EF remains low, recommend both [place] and [place] or [place].

## 2022-11-24 NOTE — Assessment & Plan Note (Signed)
Home health PT?

## 2022-11-24 NOTE — Patient Instructions (Signed)
Lab today.  Continue current medications.  Consider Entresto and Jardiance/Farxiga.  Follow up in 3 months.

## 2022-11-24 NOTE — Assessment & Plan Note (Signed)
A1c and urine microalbumin today.  Continue current dosing of insulin.  Will likely need decreased given hypoglycemia if A1c continues to be very well-controlled.

## 2022-11-24 NOTE — Assessment & Plan Note (Signed)
BP well-controlled.  Continue losartan, Lasix, metoprolol.

## 2022-11-24 NOTE — Assessment & Plan Note (Signed)
At goal on Crestor.  Continue. 

## 2022-11-24 NOTE — Progress Notes (Signed)
Subjective:  Patient ID: Sue Andrews, female    DOB: December 11, 1940  Age: 82 y.o. MRN: [id number]  CC:  Follow up  HPI:  82 year old female with the below mentioned medical problems presents for follow-up.  Patient reports that she has had 3 hypoglycemic episodes this week.  She is on Lantus 50 units daily.  Needs A1c.  Patient has recently seen cardiology.  Has echo scheduled for December.  Will discuss Entresto and SGLT2 today.  She is currently on metoprolol, losartan, Lasix.  She states that overall she is doing well from a cardiac standpoint.  Blood pressure is well-controlled.  Patient reports that she is having issues with balance.  She feels that she needs a cane.  She wants to get back to doing her normal activities without the use of a cane.  Will discuss home health PT.  Patient Active Problem List   Diagnosis Date Noted   Balance problem 11/24/2022   Essential hypertension 05/27/2022   Controlled type 2 diabetes mellitus with diabetic polyneuropathy, without long-term current use of insulin (HCC) 05/27/2022   Atrial fibrillation (HCC) 09/24/2021   HFrEF (heart failure with reduced ejection fraction) (HCC) 09/24/2021   Hyperlipidemia associated with type 2 diabetes mellitus (HCC) 09/24/2021   Coronary artery disease of native artery of native heart with stable angina pectoris (HCC) 09/24/2021    Social Hx   Social History   Socioeconomic History   Marital status: Widowed    Spouse name: Not on file   Number of children: 2   Years of education: Not on file   Highest education level: Not on file  Occupational History   Not on file  Tobacco Use   Smoking status: Never   Smokeless tobacco: Never  Vaping Use   Vaping status: Never Used  Substance and Sexual Activity   Alcohol use: Not Currently   Drug use: Never   Sexual activity: Not on file  Other Topics Concern   Not on file  Social History Narrative   Lives with son   Social Determinants of Health    Financial Resource Strain: Not on file  Food Insecurity: Not on file  Transportation Needs: Not on file  Physical Activity: Not on file  Stress: Not on file  Social Connections: Not on file    Review of Systems  Respiratory: Negative.    Cardiovascular: Negative.   Musculoskeletal:  Positive for gait problem.    Objective:  BP 124/88   Pulse 97   Wt 208 lb 12.8 oz (94.7 kg)   SpO2 96%   BMI 36.99 kg/m      11/24/2022    1:12 PM 09/24/2022    2:36 PM 05/26/2022    1:45 PM  BP/Weight  Systolic BP 124 120 128  Diastolic BP 88 72 70  Wt. (Lbs) 208.8 208.6 209  BMI 36.99 kg/m2 36.95 kg/m2 37.02 kg/m2    Physical Exam Vitals and nursing note reviewed.  Constitutional:      Appearance: Normal appearance. She is obese.  HENT:     Head: Normocephalic and atraumatic.  Eyes:     General:        Right eye: No discharge.        Left eye: No discharge.     Conjunctiva/sclera: Conjunctivae normal.  Cardiovascular:     Comments: Irregularly irregular. Pulmonary:     Effort: Pulmonary effort is normal.     Breath sounds: Normal breath sounds. No wheezing, rhonchi or rales.  Neurological:  Mental Status: She is alert.  Psychiatric:        Mood and Affect: Mood normal.        Behavior: Behavior normal.     Lab Results  Component Value Date   WBC 5.7 05/26/2022   HGB 14.6 05/26/2022   HCT 43.0 05/26/2022   PLT 166 05/26/2022   GLUCOSE 82 05/26/2022   CHOL 147 05/26/2022   TRIG 130 05/26/2022   HDL 70 05/26/2022   LDLCALC 55 05/26/2022   ALT 27 05/26/2022   AST 31 05/26/2022   NA 141 05/26/2022   K 4.0 05/26/2022   CL 102 05/26/2022   CREATININE 0.88 05/26/2022   BUN 26 05/26/2022   CO2 25 05/26/2022   TSH 2.010 05/26/2022   INR 1.2 04/18/2021   HGBA1C 6.0 (H) 05/26/2022     Assessment & Plan:   Problem List Items Addressed This Visit       Cardiovascular and Mediastinum   HFrEF (heart failure with reduced ejection fraction) (HCC)    Echo  scheduled for December.  If EF remains low, recommend both [place] and [place] or [place].      Essential hypertension    BP well-controlled.  Continue losartan, Lasix, metoprolol.      Atrial fibrillation (HCC)    Continue Eliquis and metoprolol.        Endocrine   Hyperlipidemia associated with type 2 diabetes mellitus (HCC)    At goal on Crestor.  Continue.      Controlled type 2 diabetes mellitus with diabetic polyneuropathy, without long-term current use of insulin (HCC)    A1c and urine microalbumin today.  Continue current dosing of insulin.  Will likely need decreased given hypoglycemia if A1c continues to be very well-controlled.      Relevant Orders   Hemoglobin A1c   Microalbumin / creatinine urine ratio     Other   Balance problem    Home health PT.      Relevant Orders   Home Health   Face-to-face encounter (required for Medicare/Medicaid patients)    Follow-up:  Return in about 3 months (around 02/24/2023).  Everlene Other DO [facility]

## 2022-11-25 LAB — MICROALBUMIN / CREATININE URINE RATIO
Creatinine, Urine: 89.4 mg/dL
Microalb/Creat Ratio: 31 mg/g{creat} — ABNORMAL HIGH (ref 0–29)
Microalbumin, Urine: 27.7 ug/mL

## 2022-11-25 LAB — HEMOGLOBIN A1C
Est. average glucose Bld gHb Est-mCnc: 148 mg/dL
Hgb A1c MFr Bld: 6.8 % — ABNORMAL HIGH (ref 4.8–5.6)

## 2022-12-28 ENCOUNTER — Encounter: Payer: Self-pay | Admitting: Family Medicine

## 2022-12-29 ENCOUNTER — Other Ambulatory Visit: Payer: Self-pay

## 2022-12-29 DIAGNOSIS — R2689 Other abnormalities of gait and mobility: Secondary | ICD-10-CM

## 2022-12-31 ENCOUNTER — Telehealth: Payer: Self-pay

## 2022-12-31 NOTE — Telephone Encounter (Signed)
Scarlet from [facility] needs verbal for PT phone number is [phone]

## 2023-01-04 DIAGNOSIS — I4891 Unspecified atrial fibrillation: Secondary | ICD-10-CM | POA: Diagnosis not present

## 2023-01-04 DIAGNOSIS — Z7982 Long term (current) use of aspirin: Secondary | ICD-10-CM | POA: Diagnosis not present

## 2023-01-04 DIAGNOSIS — Z794 Long term (current) use of insulin: Secondary | ICD-10-CM | POA: Diagnosis not present

## 2023-01-04 DIAGNOSIS — E079 Disorder of thyroid, unspecified: Secondary | ICD-10-CM | POA: Diagnosis not present

## 2023-01-04 DIAGNOSIS — Z7901 Long term (current) use of anticoagulants: Secondary | ICD-10-CM | POA: Diagnosis not present

## 2023-01-04 DIAGNOSIS — E785 Hyperlipidemia, unspecified: Secondary | ICD-10-CM | POA: Diagnosis not present

## 2023-01-04 DIAGNOSIS — I110 Hypertensive heart disease with heart failure: Secondary | ICD-10-CM | POA: Diagnosis not present

## 2023-01-04 DIAGNOSIS — I25118 Atherosclerotic heart disease of native coronary artery with other forms of angina pectoris: Secondary | ICD-10-CM | POA: Diagnosis not present

## 2023-01-04 DIAGNOSIS — E1169 Type 2 diabetes mellitus with other specified complication: Secondary | ICD-10-CM | POA: Diagnosis not present

## 2023-01-04 DIAGNOSIS — E1142 Type 2 diabetes mellitus with diabetic polyneuropathy: Secondary | ICD-10-CM | POA: Diagnosis not present

## 2023-01-04 DIAGNOSIS — M545 Low back pain, unspecified: Secondary | ICD-10-CM | POA: Diagnosis not present

## 2023-01-04 DIAGNOSIS — I5022 Chronic systolic (congestive) heart failure: Secondary | ICD-10-CM | POA: Diagnosis not present

## 2023-01-04 NOTE — Telephone Encounter (Signed)
Called back to inform, no voicemail available

## 2023-01-06 ENCOUNTER — Telehealth: Payer: Self-pay | Admitting: Family Medicine

## 2023-01-06 NOTE — Telephone Encounter (Signed)
[facility] -(Stacie) called needing verbal orders for PT 2weeks for 3 weeks once a for 5 weeks. [phone]

## 2023-01-06 NOTE — Telephone Encounter (Signed)
Coral Spikes, DO   ? ?Okay to give verbal.   ? ?

## 2023-01-08 NOTE — Telephone Encounter (Signed)
Verbal order given to [facility] with Home Health

## 2023-01-08 NOTE — Telephone Encounter (Signed)
Left a message for a return call to give verbal orders.

## 2023-01-13 DIAGNOSIS — M545 Low back pain, unspecified: Secondary | ICD-10-CM | POA: Diagnosis not present

## 2023-01-13 DIAGNOSIS — Z794 Long term (current) use of insulin: Secondary | ICD-10-CM | POA: Diagnosis not present

## 2023-01-13 DIAGNOSIS — E1169 Type 2 diabetes mellitus with other specified complication: Secondary | ICD-10-CM | POA: Diagnosis not present

## 2023-01-13 DIAGNOSIS — I5022 Chronic systolic (congestive) heart failure: Secondary | ICD-10-CM | POA: Diagnosis not present

## 2023-01-13 DIAGNOSIS — E785 Hyperlipidemia, unspecified: Secondary | ICD-10-CM | POA: Diagnosis not present

## 2023-01-13 DIAGNOSIS — E1142 Type 2 diabetes mellitus with diabetic polyneuropathy: Secondary | ICD-10-CM | POA: Diagnosis not present

## 2023-01-13 DIAGNOSIS — I4891 Unspecified atrial fibrillation: Secondary | ICD-10-CM | POA: Diagnosis not present

## 2023-01-13 DIAGNOSIS — Z7982 Long term (current) use of aspirin: Secondary | ICD-10-CM | POA: Diagnosis not present

## 2023-01-13 DIAGNOSIS — I25118 Atherosclerotic heart disease of native coronary artery with other forms of angina pectoris: Secondary | ICD-10-CM | POA: Diagnosis not present

## 2023-01-13 DIAGNOSIS — E079 Disorder of thyroid, unspecified: Secondary | ICD-10-CM | POA: Diagnosis not present

## 2023-01-13 DIAGNOSIS — I110 Hypertensive heart disease with heart failure: Secondary | ICD-10-CM | POA: Diagnosis not present

## 2023-01-13 DIAGNOSIS — Z7901 Long term (current) use of anticoagulants: Secondary | ICD-10-CM | POA: Diagnosis not present

## 2023-01-15 DIAGNOSIS — E079 Disorder of thyroid, unspecified: Secondary | ICD-10-CM | POA: Diagnosis not present

## 2023-01-15 DIAGNOSIS — I5022 Chronic systolic (congestive) heart failure: Secondary | ICD-10-CM | POA: Diagnosis not present

## 2023-01-15 DIAGNOSIS — I110 Hypertensive heart disease with heart failure: Secondary | ICD-10-CM | POA: Diagnosis not present

## 2023-01-15 DIAGNOSIS — Z7982 Long term (current) use of aspirin: Secondary | ICD-10-CM | POA: Diagnosis not present

## 2023-01-15 DIAGNOSIS — Z794 Long term (current) use of insulin: Secondary | ICD-10-CM | POA: Diagnosis not present

## 2023-01-15 DIAGNOSIS — Z7901 Long term (current) use of anticoagulants: Secondary | ICD-10-CM | POA: Diagnosis not present

## 2023-01-15 DIAGNOSIS — M545 Low back pain, unspecified: Secondary | ICD-10-CM | POA: Diagnosis not present

## 2023-01-15 DIAGNOSIS — E1169 Type 2 diabetes mellitus with other specified complication: Secondary | ICD-10-CM | POA: Diagnosis not present

## 2023-01-15 DIAGNOSIS — E785 Hyperlipidemia, unspecified: Secondary | ICD-10-CM | POA: Diagnosis not present

## 2023-01-15 DIAGNOSIS — I4891 Unspecified atrial fibrillation: Secondary | ICD-10-CM | POA: Diagnosis not present

## 2023-01-15 DIAGNOSIS — I25118 Atherosclerotic heart disease of native coronary artery with other forms of angina pectoris: Secondary | ICD-10-CM | POA: Diagnosis not present

## 2023-01-15 DIAGNOSIS — E1142 Type 2 diabetes mellitus with diabetic polyneuropathy: Secondary | ICD-10-CM | POA: Diagnosis not present

## 2023-01-20 DIAGNOSIS — I110 Hypertensive heart disease with heart failure: Secondary | ICD-10-CM | POA: Diagnosis not present

## 2023-01-20 DIAGNOSIS — E1142 Type 2 diabetes mellitus with diabetic polyneuropathy: Secondary | ICD-10-CM | POA: Diagnosis not present

## 2023-01-20 DIAGNOSIS — I4891 Unspecified atrial fibrillation: Secondary | ICD-10-CM | POA: Diagnosis not present

## 2023-01-20 DIAGNOSIS — E079 Disorder of thyroid, unspecified: Secondary | ICD-10-CM | POA: Diagnosis not present

## 2023-01-20 DIAGNOSIS — I5022 Chronic systolic (congestive) heart failure: Secondary | ICD-10-CM | POA: Diagnosis not present

## 2023-01-20 DIAGNOSIS — Z794 Long term (current) use of insulin: Secondary | ICD-10-CM | POA: Diagnosis not present

## 2023-01-20 DIAGNOSIS — E1169 Type 2 diabetes mellitus with other specified complication: Secondary | ICD-10-CM | POA: Diagnosis not present

## 2023-01-20 DIAGNOSIS — I25118 Atherosclerotic heart disease of native coronary artery with other forms of angina pectoris: Secondary | ICD-10-CM | POA: Diagnosis not present

## 2023-01-20 DIAGNOSIS — Z7901 Long term (current) use of anticoagulants: Secondary | ICD-10-CM | POA: Diagnosis not present

## 2023-01-20 DIAGNOSIS — Z7982 Long term (current) use of aspirin: Secondary | ICD-10-CM | POA: Diagnosis not present

## 2023-01-20 DIAGNOSIS — E785 Hyperlipidemia, unspecified: Secondary | ICD-10-CM | POA: Diagnosis not present

## 2023-01-20 DIAGNOSIS — M545 Low back pain, unspecified: Secondary | ICD-10-CM | POA: Diagnosis not present

## 2023-01-22 DIAGNOSIS — I110 Hypertensive heart disease with heart failure: Secondary | ICD-10-CM | POA: Diagnosis not present

## 2023-01-22 DIAGNOSIS — M545 Low back pain, unspecified: Secondary | ICD-10-CM | POA: Diagnosis not present

## 2023-01-22 DIAGNOSIS — Z7982 Long term (current) use of aspirin: Secondary | ICD-10-CM | POA: Diagnosis not present

## 2023-01-22 DIAGNOSIS — E079 Disorder of thyroid, unspecified: Secondary | ICD-10-CM | POA: Diagnosis not present

## 2023-01-22 DIAGNOSIS — I25118 Atherosclerotic heart disease of native coronary artery with other forms of angina pectoris: Secondary | ICD-10-CM | POA: Diagnosis not present

## 2023-01-22 DIAGNOSIS — Z794 Long term (current) use of insulin: Secondary | ICD-10-CM | POA: Diagnosis not present

## 2023-01-22 DIAGNOSIS — E785 Hyperlipidemia, unspecified: Secondary | ICD-10-CM | POA: Diagnosis not present

## 2023-01-22 DIAGNOSIS — E1142 Type 2 diabetes mellitus with diabetic polyneuropathy: Secondary | ICD-10-CM | POA: Diagnosis not present

## 2023-01-22 DIAGNOSIS — E1169 Type 2 diabetes mellitus with other specified complication: Secondary | ICD-10-CM | POA: Diagnosis not present

## 2023-01-22 DIAGNOSIS — Z7901 Long term (current) use of anticoagulants: Secondary | ICD-10-CM | POA: Diagnosis not present

## 2023-01-22 DIAGNOSIS — I5022 Chronic systolic (congestive) heart failure: Secondary | ICD-10-CM | POA: Diagnosis not present

## 2023-01-22 DIAGNOSIS — I4891 Unspecified atrial fibrillation: Secondary | ICD-10-CM | POA: Diagnosis not present

## 2023-01-27 DIAGNOSIS — I5022 Chronic systolic (congestive) heart failure: Secondary | ICD-10-CM | POA: Diagnosis not present

## 2023-01-27 DIAGNOSIS — I110 Hypertensive heart disease with heart failure: Secondary | ICD-10-CM | POA: Diagnosis not present

## 2023-01-27 DIAGNOSIS — Z7901 Long term (current) use of anticoagulants: Secondary | ICD-10-CM | POA: Diagnosis not present

## 2023-01-27 DIAGNOSIS — E079 Disorder of thyroid, unspecified: Secondary | ICD-10-CM | POA: Diagnosis not present

## 2023-01-27 DIAGNOSIS — M545 Low back pain, unspecified: Secondary | ICD-10-CM | POA: Diagnosis not present

## 2023-01-27 DIAGNOSIS — E785 Hyperlipidemia, unspecified: Secondary | ICD-10-CM | POA: Diagnosis not present

## 2023-01-27 DIAGNOSIS — I4891 Unspecified atrial fibrillation: Secondary | ICD-10-CM | POA: Diagnosis not present

## 2023-01-27 DIAGNOSIS — E1169 Type 2 diabetes mellitus with other specified complication: Secondary | ICD-10-CM | POA: Diagnosis not present

## 2023-01-27 DIAGNOSIS — Z7982 Long term (current) use of aspirin: Secondary | ICD-10-CM | POA: Diagnosis not present

## 2023-01-27 DIAGNOSIS — I25118 Atherosclerotic heart disease of native coronary artery with other forms of angina pectoris: Secondary | ICD-10-CM | POA: Diagnosis not present

## 2023-01-27 DIAGNOSIS — Z794 Long term (current) use of insulin: Secondary | ICD-10-CM | POA: Diagnosis not present

## 2023-01-27 DIAGNOSIS — E1142 Type 2 diabetes mellitus with diabetic polyneuropathy: Secondary | ICD-10-CM | POA: Diagnosis not present

## 2023-02-02 DIAGNOSIS — E1142 Type 2 diabetes mellitus with diabetic polyneuropathy: Secondary | ICD-10-CM | POA: Diagnosis not present

## 2023-02-02 DIAGNOSIS — Z794 Long term (current) use of insulin: Secondary | ICD-10-CM | POA: Diagnosis not present

## 2023-02-02 DIAGNOSIS — E1169 Type 2 diabetes mellitus with other specified complication: Secondary | ICD-10-CM | POA: Diagnosis not present

## 2023-02-02 DIAGNOSIS — E785 Hyperlipidemia, unspecified: Secondary | ICD-10-CM | POA: Diagnosis not present

## 2023-02-02 DIAGNOSIS — M545 Low back pain, unspecified: Secondary | ICD-10-CM | POA: Diagnosis not present

## 2023-02-02 DIAGNOSIS — Z7901 Long term (current) use of anticoagulants: Secondary | ICD-10-CM | POA: Diagnosis not present

## 2023-02-02 DIAGNOSIS — E079 Disorder of thyroid, unspecified: Secondary | ICD-10-CM | POA: Diagnosis not present

## 2023-02-02 DIAGNOSIS — I5022 Chronic systolic (congestive) heart failure: Secondary | ICD-10-CM | POA: Diagnosis not present

## 2023-02-02 DIAGNOSIS — Z7982 Long term (current) use of aspirin: Secondary | ICD-10-CM | POA: Diagnosis not present

## 2023-02-02 DIAGNOSIS — I110 Hypertensive heart disease with heart failure: Secondary | ICD-10-CM | POA: Diagnosis not present

## 2023-02-02 DIAGNOSIS — I4891 Unspecified atrial fibrillation: Secondary | ICD-10-CM | POA: Diagnosis not present

## 2023-02-02 DIAGNOSIS — I25118 Atherosclerotic heart disease of native coronary artery with other forms of angina pectoris: Secondary | ICD-10-CM | POA: Diagnosis not present

## 2023-02-10 ENCOUNTER — Other Ambulatory Visit: Payer: Self-pay | Admitting: Family Medicine

## 2023-02-23 ENCOUNTER — Ambulatory Visit: Payer: Medicare Other | Admitting: Family Medicine

## 2023-02-23 VITALS — BP 126/78 | Ht 63.0 in | Wt 206.8 lb

## 2023-02-23 DIAGNOSIS — E785 Hyperlipidemia, unspecified: Secondary | ICD-10-CM

## 2023-02-23 DIAGNOSIS — I5020 Unspecified systolic (congestive) heart failure: Secondary | ICD-10-CM | POA: Diagnosis not present

## 2023-02-23 DIAGNOSIS — E1142 Type 2 diabetes mellitus with diabetic polyneuropathy: Secondary | ICD-10-CM

## 2023-02-23 DIAGNOSIS — I4891 Unspecified atrial fibrillation: Secondary | ICD-10-CM | POA: Diagnosis not present

## 2023-02-23 DIAGNOSIS — J0100 Acute maxillary sinusitis, unspecified: Secondary | ICD-10-CM | POA: Diagnosis not present

## 2023-02-23 DIAGNOSIS — E1169 Type 2 diabetes mellitus with other specified complication: Secondary | ICD-10-CM | POA: Diagnosis not present

## 2023-02-23 DIAGNOSIS — I10 Essential (primary) hypertension: Secondary | ICD-10-CM

## 2023-02-23 DIAGNOSIS — J019 Acute sinusitis, unspecified: Secondary | ICD-10-CM | POA: Insufficient documentation

## 2023-02-23 MED ORDER — AMOXICILLIN-POT CLAVULANATE 875-125 MG PO TABS: 1.0000 | ORAL_TABLET | Freq: Two times a day (BID) | ORAL | 0 refills | Status: DC

## 2023-02-23 NOTE — Assessment & Plan Note (Signed)
Continue current medications. 

## 2023-02-23 NOTE — Assessment & Plan Note (Signed)
At goal.  

## 2023-02-23 NOTE — Assessment & Plan Note (Signed)
Stable.  Will continue monitor closely.

## 2023-02-23 NOTE — Progress Notes (Signed)
Subjective:  Patient ID: Sue Andrews, female    DOB: Oct 18, 1940  Age: 82 y.o. MRN: [id number]  CC:   Chief Complaint  Patient presents with   Diabetes    Follow up Patient having some sinus issues and would like her ears checked    HPI:  82 year old female with hypertension, type 2 diabetes, atrial fibrillation, HFrEF, hyperlipidemia, CAD presents for follow-up.  Patient reports that over the past few weeks she has had issues with the sinuses.  She states that she initially improved with over-the-counter treatment but then worsened again over the past 2 weeks.  She reports sinus pressure and discolored nasal discharge.  Sinus pressure is predominantly over the right maxillary sinus.  Patient is concerned given the upcoming holiday season.  From a cardiac perspective, she is doing well.  She has an upcoming echo and December.  Blood pressure well-controlled on metoprolol, losartan, and Lasix.  Lipids have been at goal on Crestor.  Patient Active Problem List   Diagnosis Date Noted   Acute sinusitis 02/23/2023   Balance problem 11/24/2022   Essential hypertension 05/27/2022   Controlled type 2 diabetes mellitus with diabetic polyneuropathy, without long-term current use of insulin (HCC) 05/27/2022   Atrial fibrillation (HCC) 09/24/2021   HFrEF (heart failure with reduced ejection fraction) (HCC) 09/24/2021   Hyperlipidemia associated with type 2 diabetes mellitus (HCC) 09/24/2021   Coronary artery disease of native artery of native heart with stable angina pectoris (HCC) 09/24/2021    Social Hx   Social History   Socioeconomic History   Marital status: Widowed    Spouse name: Not on file   Number of children: 2   Years of education: Not on file   Highest education level: Not on file  Occupational History   Not on file  Tobacco Use   Smoking status: Never   Smokeless tobacco: Never  Vaping Use   Vaping status: Never Used  Substance and Sexual Activity   Alcohol  use: Not Currently   Drug use: Never   Sexual activity: Not on file  Other Topics Concern   Not on file  Social History Narrative   Lives with son   Social Determinants of Health   Financial Resource Strain: Not on file  Food Insecurity: Not on file  Transportation Needs: Not on file  Physical Activity: Not on file  Stress: Not on file  Social Connections: Not on file    Review of Systems  Respiratory: Negative.    Cardiovascular: Negative.    Per HPI  Objective:  BP 126/78   Ht 5\' 3"  (1.6 m)   Wt 206 lb 12.8 oz (93.8 kg)   BMI 36.63 kg/m      02/23/2023    1:12 PM 11/24/2022    1:12 PM 09/24/2022    2:36 PM  BP/Weight  Systolic BP 126 124 120  Diastolic BP 78 88 72  Wt. (Lbs) 206.8 208.8 208.6  BMI 36.63 kg/m2 36.99 kg/m2 36.95 kg/m2    Physical Exam Vitals and nursing note reviewed.  Constitutional:      General: She is not in acute distress.    Appearance: Normal appearance.  HENT:     Head: Normocephalic and atraumatic.     Right Ear: Tympanic membrane normal.     Nose:     Comments: Right maxillary sinus tenderness to palpation. Eyes:     General:        Right eye: No discharge.  Left eye: No discharge.     Conjunctiva/sclera: Conjunctivae normal.  Cardiovascular:     Comments: Irregularly irregular. Pulmonary:     Effort: Pulmonary effort is normal.     Breath sounds: Normal breath sounds. No wheezing, rhonchi or rales.  Neurological:     Mental Status: She is alert.     Lab Results  Component Value Date   WBC 5.7 05/26/2022   HGB 14.6 05/26/2022   HCT 43.0 05/26/2022   PLT 166 05/26/2022   GLUCOSE 82 05/26/2022   CHOL 147 05/26/2022   TRIG 130 05/26/2022   HDL 70 05/26/2022   LDLCALC 55 05/26/2022   ALT 27 05/26/2022   AST 31 05/26/2022   NA 141 05/26/2022   K 4.0 05/26/2022   CL 102 05/26/2022   CREATININE 0.88 05/26/2022   BUN 26 05/26/2022   CO2 25 05/26/2022   TSH 2.010 05/26/2022   INR 1.2 04/18/2021   HGBA1C 6.8  (H) 11/24/2022     Assessment & Plan:   Problem List Items Addressed This Visit       Cardiovascular and Mediastinum   HFrEF (heart failure with reduced ejection fraction) (HCC)    Euvolemic today.  Continue current medications.  Awaiting echo in December.  If EF is down, would benefit from starting Entresto and SGLT2.      Essential hypertension - Primary    Stable on metoprolol, losartan, and Lasix.  Continue.      Atrial fibrillation (HCC)    Continue current medications.        Respiratory   Acute sinusitis    Treating with Augmentin.      Relevant Medications   amoxicillin-clavulanate (AUGMENTIN) 875-125 MG tablet     Endocrine   Hyperlipidemia associated with type 2 diabetes mellitus (HCC)    At goal.      Controlled type 2 diabetes mellitus with diabetic polyneuropathy, without long-term current use of insulin (HCC)    Stable.  Will continue monitor closely.       Meds ordered this encounter  Medications   amoxicillin-clavulanate (AUGMENTIN) 875-125 MG tablet    Sig: Take 1 tablet by mouth 2 (two) times daily.    Dispense:  14 tablet    Refill:  0    Follow-up:  Return in about 6 months (around 08/23/2023).  Everlene Other DO [facility]

## 2023-02-23 NOTE — Assessment & Plan Note (Signed)
Euvolemic today.  Continue current medications.  Awaiting echo in December.  If EF is down, would benefit from starting Entresto and SGLT2.

## 2023-02-23 NOTE — Assessment & Plan Note (Signed)
Treating with Augmentin. 

## 2023-02-23 NOTE — Patient Instructions (Signed)
Antibiotic as prescribed.  Continue your medications.  Follow up in 6 months or sooner if needed.

## 2023-02-23 NOTE — Assessment & Plan Note (Signed)
Stable on metoprolol, losartan, and Lasix.  Continue.

## 2023-02-24 ENCOUNTER — Other Ambulatory Visit: Payer: Self-pay | Admitting: Family Medicine

## 2023-03-01 IMAGING — US US EXTREM LOW VENOUS*L*
1 series · 13 of 24 positions shown · non-contrast
Comparison: None.

CLINICAL DATA: Left lower extremity swelling



[Series 1: us venous img lower uni left (dvt) · portal-venous · 13 of 30 slices shown]
[im 1/30]
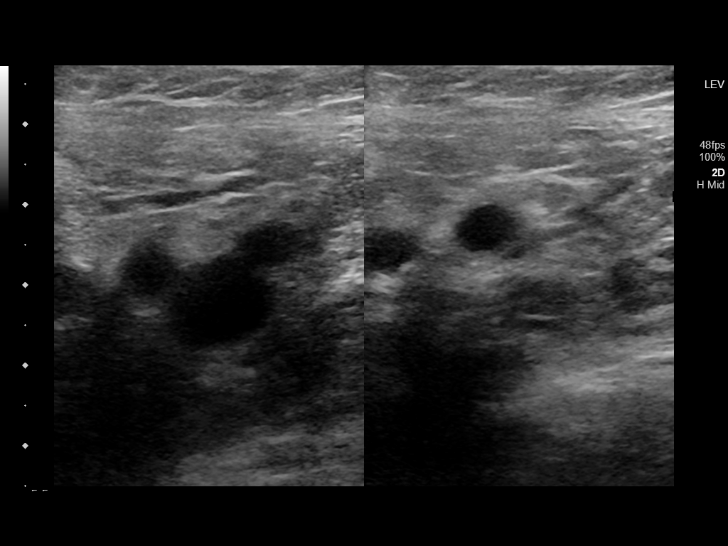
[im 3/30]
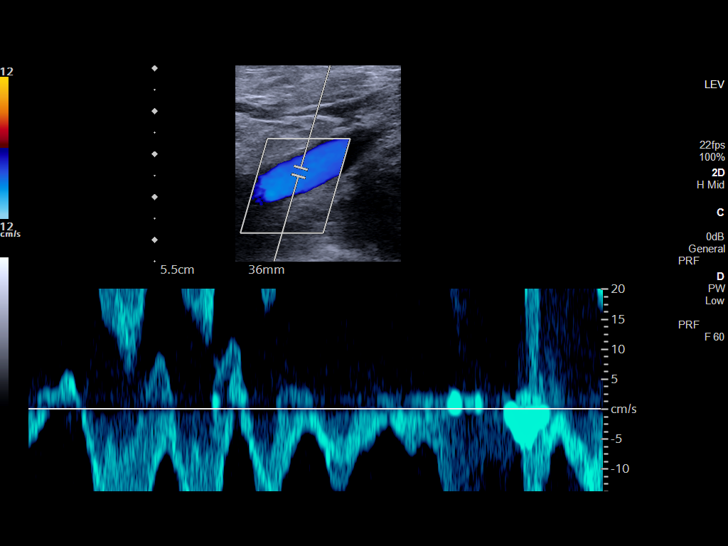
[im 6/30]
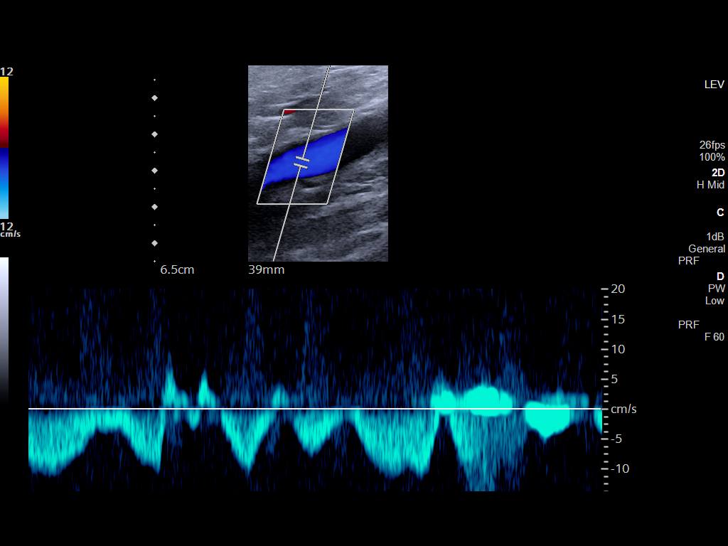
[im 8/30]
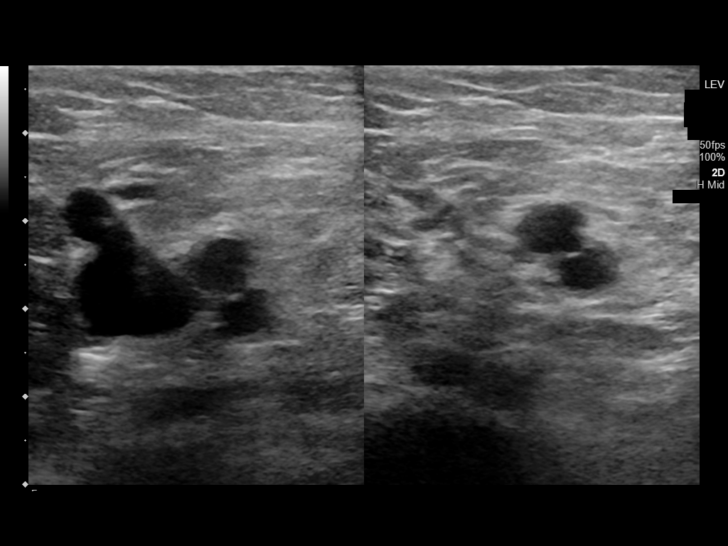
[im 11/30]
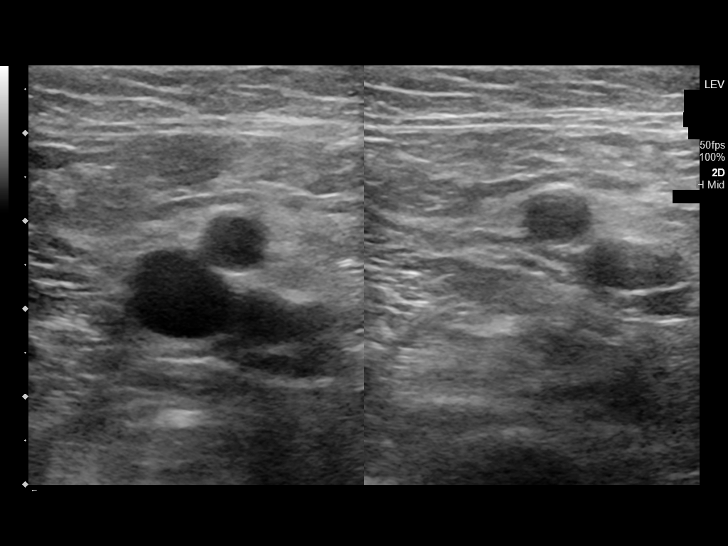
[im 13/30]
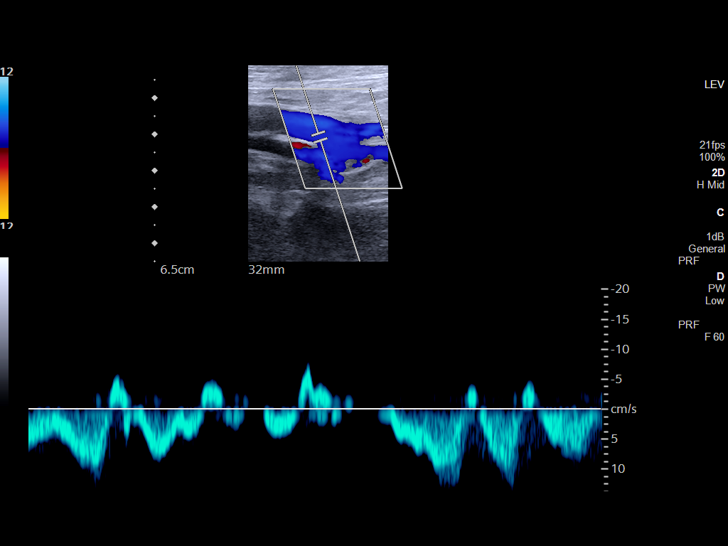
[im 16/30]
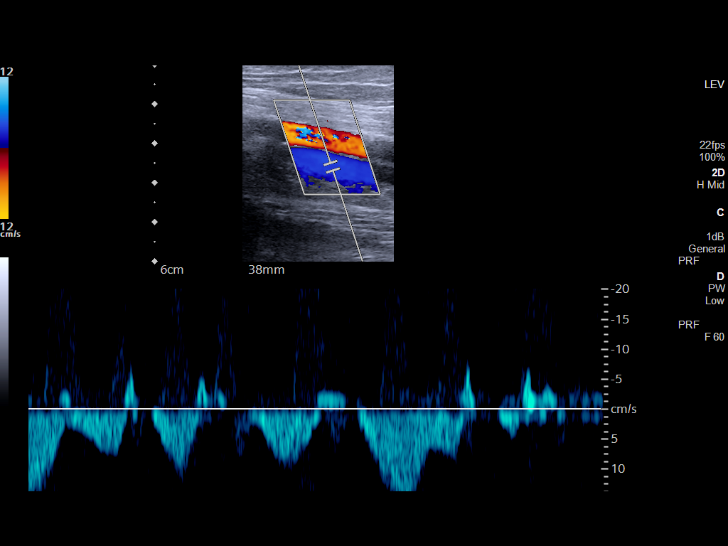
[im 17/30]
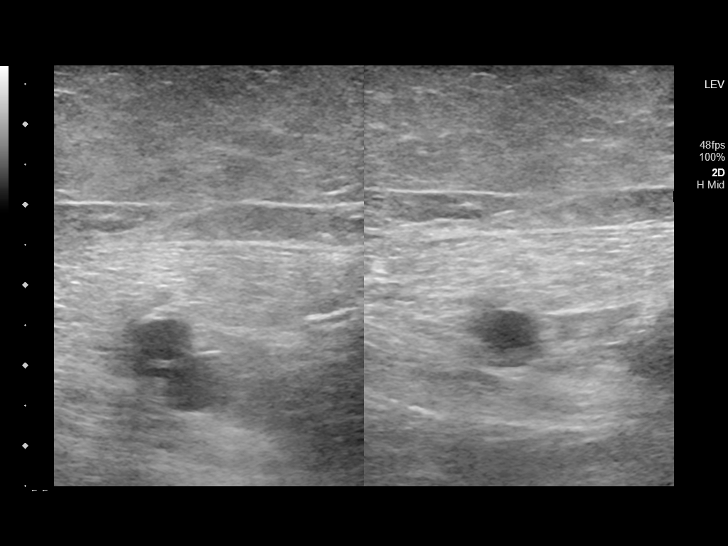
[im 19/30]
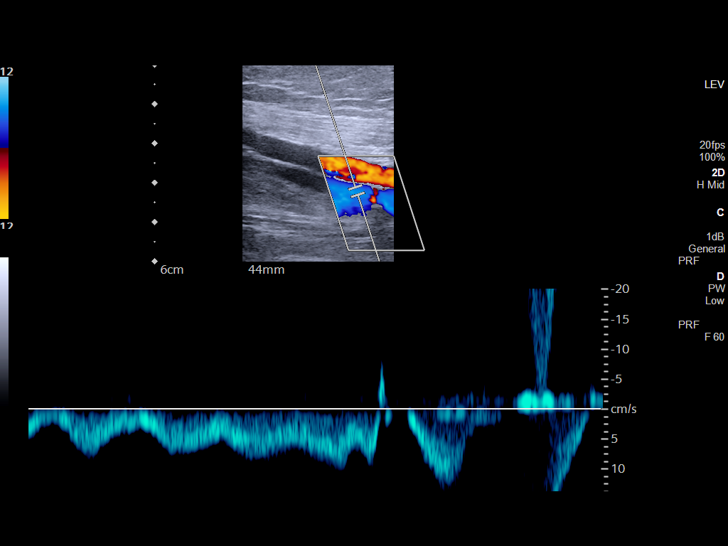
[im 22/30]
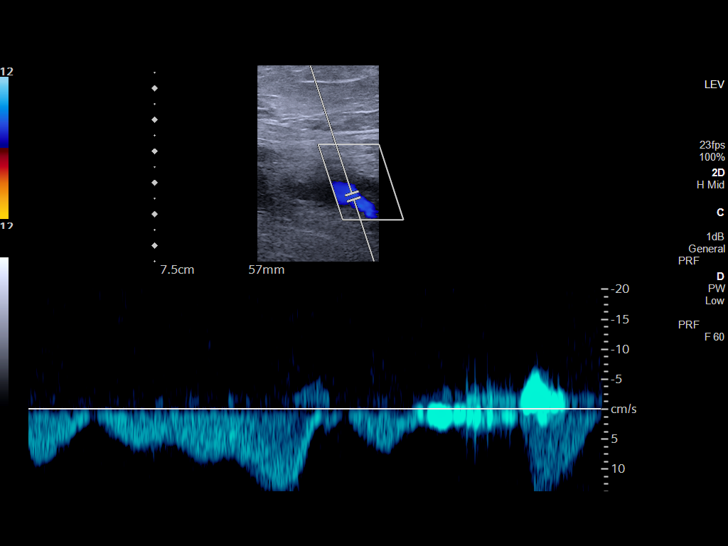
[im 24/30]
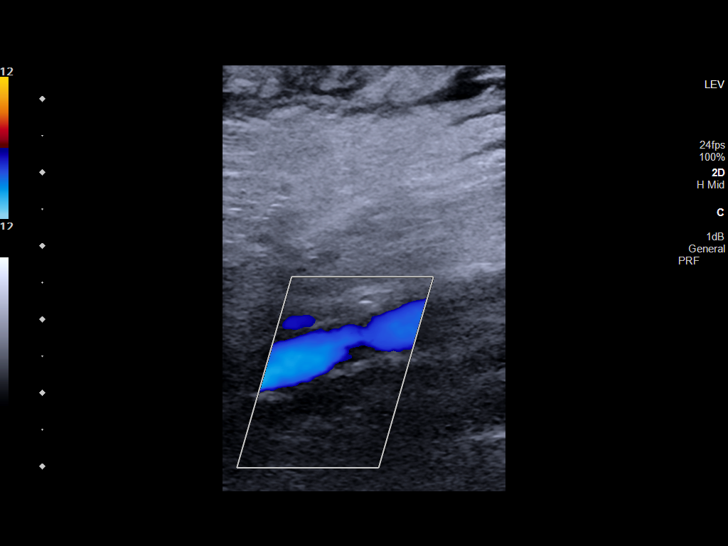
[im 27/30]
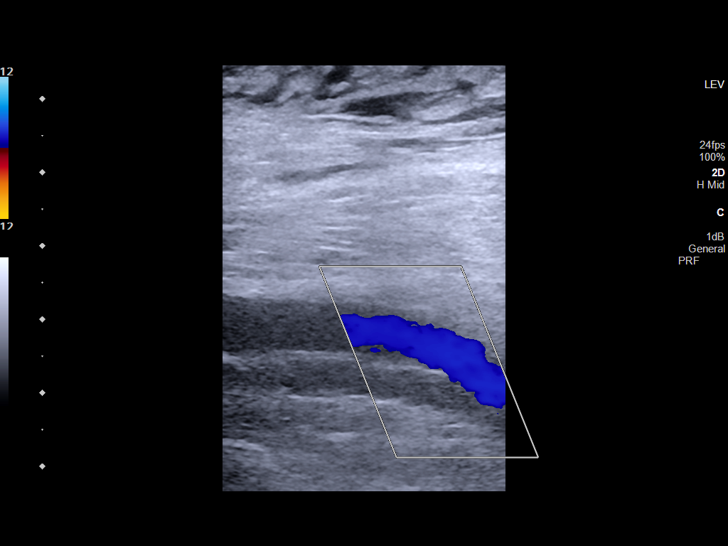
[im 30/30]
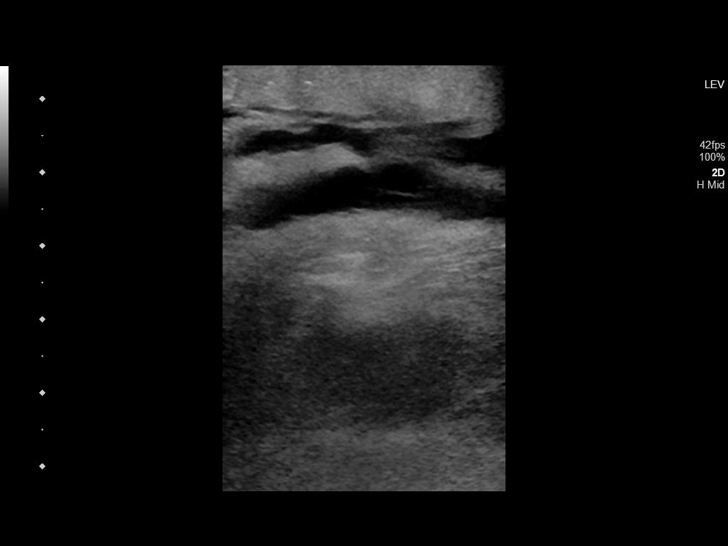

[13 of 24 positions shown; findings below may reference images not displayed]

FINDINGS: Contralateral Common Femoral Vein: Respiratory phasicity is normal
and symmetric with the symptomatic side. No evidence of thrombus.
Normal compressibility.

Common Femoral Vein: No evidence of thrombus. Normal
compressibility, respiratory phasicity and response to augmentation.

Saphenofemoral Junction: No evidence of thrombus. Normal
compressibility and flow on color Doppler imaging.

Profunda Femoral Vein: No evidence of thrombus. Normal
compressibility and flow on color Doppler imaging.

Femoral Vein: No evidence of thrombus. Normal compressibility,
respiratory phasicity and response to augmentation.

Popliteal Vein: No evidence of thrombus. Normal compressibility,
respiratory phasicity and response to augmentation.

Calf Veins: No evidence of thrombus. Normal compressibility and flow
on color Doppler imaging.

Superficial Great Saphenous Vein: No evidence of thrombus. Normal
compressibility.

Venous Reflux:  None.

Other Findings: Subcutaneous edema is noted consistent with the
given clinical history.
IMPRESSION: No evidence of deep venous thrombosis.

## 2023-03-09 ENCOUNTER — Ambulatory Visit (HOSPITAL_COMMUNITY)
Admission: RE | Admit: 2023-03-09 | Discharge: 2023-03-09 | Disposition: A | Discharge status: Home / Self Care | Payer: Medicare Other | Source: Ambulatory Visit | Attending: Family Medicine | Admitting: Family Medicine

## 2023-03-09 DIAGNOSIS — I5020 Unspecified systolic (congestive) heart failure: Secondary | ICD-10-CM | POA: Diagnosis not present

## 2023-03-09 LAB — ECHOCARDIOGRAM COMPLETE
AR max vel: 2.7 cm2
AV Area VTI: 2.53 cm2
AV Area mean vel: 2.36 cm2
AV Mean grad: 2.2 mm[Hg]
AV Peak grad: 4.9 mm[Hg]
Ao pk vel: 1.1 m/s
S' Lateral: 3.6 cm

## 2023-03-09 NOTE — Progress Notes (Signed)
  Echocardiogram 2D Echocardiogram has been performed.  Sue Andrews 03/09/2023, 2:17 PM

## 2023-03-12 ENCOUNTER — Ambulatory Visit: Payer: Medicare Other | Attending: Student | Admitting: Student

## 2023-03-12 ENCOUNTER — Encounter: Payer: Self-pay | Admitting: Student

## 2023-03-12 ENCOUNTER — Ambulatory Visit: Payer: Medicare Other

## 2023-03-12 VITALS — BP 122/68 | HR 92 | Ht 63.0 in | Wt 206.6 lb

## 2023-03-12 DIAGNOSIS — I2510 Atherosclerotic heart disease of native coronary artery without angina pectoris: Secondary | ICD-10-CM | POA: Diagnosis not present

## 2023-03-12 DIAGNOSIS — I5032 Chronic diastolic (congestive) heart failure: Secondary | ICD-10-CM | POA: Diagnosis not present

## 2023-03-12 DIAGNOSIS — I10 Essential (primary) hypertension: Secondary | ICD-10-CM | POA: Diagnosis not present

## 2023-03-12 DIAGNOSIS — I4891 Unspecified atrial fibrillation: Secondary | ICD-10-CM | POA: Diagnosis not present

## 2023-03-12 DIAGNOSIS — I340 Nonrheumatic mitral (valve) insufficiency: Secondary | ICD-10-CM

## 2023-03-12 DIAGNOSIS — I4811 Longstanding persistent atrial fibrillation: Secondary | ICD-10-CM | POA: Diagnosis not present

## 2023-03-12 NOTE — Patient Instructions (Signed)
Medication Instructions:  Your physician recommends that you continue on your current medications as directed. Please refer to the Current Medication list given to you today.  *If you need a refill on your cardiac medications before your next appointment, please call your pharmacy*   Lab Work: NONE   If you have labs (blood work) drawn today and your tests are completely normal, you will receive your results only by: MyChart Message (if you have MyChart) OR A paper copy in the mail If you have any lab test that is abnormal or we need to change your treatment, we will call you to review the results.   Testing/Procedures: Christena Deem- Long Term Monitor Instructions   Your physician has requested you wear your ZIO patch monitor___7___days.   This is a single patch monitor.  [employer] supplies one patch monitor per enrollment.  Additional stickers are not available.   Please do not apply patch if you will be having a Nuclear Stress Test, Echocardiogram, Cardiac CT, MRI, or Chest Xray during the time frame you would be wearing the monitor. The patch cannot be worn during these tests.  You cannot remove and re-apply the ZIO XT patch monitor.   Your ZIO patch monitor will be sent USPS Priority mail from [facility] directly to your home address. The monitor may also be mailed to a PO BOX if home delivery is not available.   It may take 3-5 days to receive your monitor after you have been enrolled.   Once you have received you monitor, please review enclosed instructions.  Your monitor has already been registered assigning a specific monitor serial # to you.   Applying the monitor   Shave hair from upper left chest.   Hold abrader disc by orange tab.  Rub abrader in 40 strokes over left upper chest as indicated in your monitor instructions.   Clean area with 4 enclosed alcohol pads .  Use all pads to assure are is cleaned thoroughly.  Let dry.   Apply patch as indicated in monitor  instructions.  Patch will be place under collarbone on left side of chest with arrow pointing upward.   Rub patch adhesive wings for 2 minutes.Remove white label marked "1".  Remove white label marked "2".  Rub patch adhesive wings for 2 additional minutes.   While looking in a mirror, press and release button in center of patch.  A small green light will flash 3-4 times .  This will be your only indicator the monitor has been turned on.     Do not shower for the first 24 hours.  You may shower after the first 24 hours.   Press button if you feel a symptom. You will hear a small click.  Record Date, Time and Symptom in the Patient Log Book.   When you are ready to remove patch, follow instructions on last 2 pages of Patient Log Book.  Stick patch monitor onto last page of Patient Log Book.   Place Patient Log Book in [place] box.  Use locking tab on box and tape box closed securely.  The Orange and [employer] has [employer] on it.  Please place in mailbox as soon as possible.  Your physician should have your test results approximately 7 days after the monitor has been mailed back to [facility].   Call [facility] Customer Care at [phone] if you have questions regarding your ZIO XT patch monitor.  Call them immediately if you see an orange  light blinking on your monitor.   If your monitor falls off in less than 4 days contact our Monitor department at [phone].  If your monitor becomes loose or falls off after 4 days call [employer] at [phone] for suggestions on securing your monitor.     Follow-Up: At [facility], you and your health needs are our priority.  As part of our continuing mission to provide you with exceptional heart care, we have created designated Provider Care Teams.  These Care Teams include your primary Cardiologist (physician) and Advanced Practice Providers (APPs -  Physician Assistants and Nurse Practitioners) who all work together to provide  you with the care you need, when you need it.  We recommend signing up for the patient portal called "MyChart".  Sign up information is provided on this After Visit Summary.  MyChart is used to connect with patients for Virtual Visits (Telemedicine).  Patients are able to view lab/test results, encounter notes, upcoming appointments, etc.  Non-urgent messages can be sent to your provider as well.   To learn more about what you can do with MyChart, go to [email or web address].    Your next appointment:   6 month(s)  Provider:   Randall An, PA-C    Other Instructions Thank you for choosing [facility]!

## 2023-03-12 NOTE — Progress Notes (Signed)
Cardiology Office Note    Date:  03/12/2023  ID:  Sue Andrews, DOB 11-22-1940, MRN [id number] Cardiologist: Previously evaluated by Dr. Izora Ribas --> prefers to follow-up in [facility]     History of Present Illness:    Sue Andrews is a 82 y.o. female with past medical history of CAD (s/p prior DES in 2021 in [place] with full details unavailable), HFrEF (EF 30% in 2021, at 40 to 45% in 10/2021), persistent atrial fibrillation, mitral regurgitation, COPD, HTN, HLD and Type 2 DM who presents to the office today for 28-month follow-up.  She was examined by myself in 09/2022 and reported overall doing well at that time and denied any recent anginal symptoms. She reported having chronic lower extremity edema and did not tolerate higher doses of diuretic therapy in the past due to frequent urination. Options in regards to medication titration were reviewed including switching Losartan to Entresto or adding an SGLT2 inhibitor but she preferred to continue her current medications given that she was overall doing well at that time. It was recommended to obtain a follow-up echocardiogram prior to her next visit and if EF had declined, she was open to medication changes at that time. She was continued on ASA 81 mg daily, Toprol-XL 100 mg daily, Crestor 5 mg daily, Losartan 25 mg daily, Lasix 20 mg daily and Eliquis 5 mg twice daily.  She did have a follow-up echocardiogram earlier this month which showed her EF had improved to 45 to 50% but she was noted to be tachycardic with heart rate in the 90's to 120's during the study and was recommended to consider additional rate control. Her mitral valve regurgitation remained in a mild range and RV function was normal.  In talking with the patient today, she reports overall feeling well since her last office visit. She denies any recent chest pain, palpitations or dyspnea on exertion. No specific orthopnea, PND or worsening lower extremity edema.  She does report feeling fatigued intermittently throughout the day but is unaware of any specific culprits for this. Remains on Eliquis for anticoagulation with no reports of active bleeding.  Studies Reviewed:   EKG: EKG is not ordered today.  Echocardiogram: 03/2023 IMPRESSIONS     1. LVEF assessment difficult in setting of afib with elevated rates. .  Left ventricular ejection fraction, by estimation, is 45 to 50%. The left  ventricle has mildly decreased function. The left ventricle demonstrates  global hypokinesis. Left  ventricular diastolic parameters are indeterminate.   2. Right ventricular systolic function is normal. The right ventricular  size is normal. There is normal pulmonary artery systolic pressure.   3. Left atrial size was severely dilated.   4. The mitral valve is abnormal. Mild mitral valve regurgitation.   5. The tricuspid valve is abnormal.   6. The aortic valve is tricuspid. Aortic valve regurgitation is not  visualized. No aortic stenosis is present.   7. The inferior vena cava is normal in size with greater than 50%  respiratory variability, suggesting right atrial pressure of 3 mmHg.   8. Afib throughout study 90s to 120s. May require additional rate  control. Consider limited repeat echo when better rate controlled.    Risk Assessment/Calculations:    CHA2DS2-VASc Score = 7   This indicates a 11.2% annual risk of stroke. The patient's score is based upon: CHF History: 1 HTN History: 1 Diabetes History: 1 Stroke History: 0 Vascular Disease History: 1 Age Score: 2 Gender Score: 1  Physical Exam:   VS:  BP 122/68   Pulse 92   Ht 5\' 3"  (1.6 m)   Wt 206 lb 9.6 oz (93.7 kg)   SpO2 96%   BMI 36.60 kg/m    Wt Readings from Last 3 Encounters:  03/12/23 206 lb 9.6 oz (93.7 kg)  02/23/23 206 lb 12.8 oz (93.8 kg)  11/24/22 208 lb 12.8 oz (94.7 kg)     GEN: Well nourished, well developed female appearing in no acute distress NECK: No JVD;  No carotid bruits CARDIAC: Irregularly irregular, no murmurs, rubs, gallops RESPIRATORY:  Clear to auscultation without rales, wheezing or rhonchi  ABDOMEN: Appears non-distended. No obvious abdominal masses. EXTREMITIES: No clubbing or cyanosis. No pitting edema.  Distal pedal pulses are 2+ bilaterally.   Assessment and Plan:   1. Chronic HFimpEF - Her ejection fraction was previously as low as 30% in 2021 and had improved to 45 to 50% by most recent imaging earlier this month. She appears euvolemic by examination today and denies any recent respiratory issues. She has remained on Losartan 25 mg daily, Toprol-XL 100 mg daily and Lasix 20 mg daily.  We did review possible medication adjustments again today and she prefers to continue her current regimen at this time given that she overall feels well with this. If adding an SGLT2 inhibitor in the future, would need to coordinate with her PCP for likely dose reduction of Insulin at that time. Will place a Zio patch as discussed below as we may need to titrate Toprol-XL to help with rate-control.  2. CAD/HLD - She has a history of DES placement in 2021 while in [place] but details from this are unavailable. LDL was well-controlled at 55 when checked in 05/2022. She denies any recent anginal symptoms. Continue Toprol-XL 100 mg daily and Crestor 5 mg daily. She has also been on ASA 81 mg daily while being on Eliquis for anticoagulation. If any issues with bleeding or anemia in the future, would stop ASA given no recent intervention.  3. Persistent atrial fibrillation - She previously declined DCCV and rate control has been pursued. Rates were elevated at the time of her recent echocardiogram. Her home blood pressure log shows that her heart rate has overall been in the 60's to 90's but she does report intermittent fatigue at times during the day and I am concerned this could be due to elevated rates as she is more physically active during that  timeframe. Will place a 7-day Zio patch to assess rate-control. For now, continue Toprol-XL 100 mg daily. - No reports of active bleeding. Continue Eliquis 5 mg twice daily for anticoagulation which is the appropriate dose at this time given her age, weight and renal function. CBC in 05/2022 showed her hemoglobin was stable at 14.6 with platelets at 166 K. Creatinine was also stable at 0.88 at that time.  4. Mitral valve regurgitation - This was mild by recent echocardiogram earlier this month. Will continue to follow over time.  5. HTN - Blood pressure is well-controlled at 122/68 during today's visit.  Continue current medical therapy with Losartan 25 mg daily and Toprol-XL 100 mg daily.  Signed, Ellsworth Lennox, PA-C

## 2023-03-24 DIAGNOSIS — I4891 Unspecified atrial fibrillation: Secondary | ICD-10-CM | POA: Diagnosis not present

## 2023-03-30 ENCOUNTER — Telehealth: Payer: Self-pay

## 2023-03-30 MED ORDER — METOPROLOL SUCCINATE ER 25 MG PO TB24: 25.0000 mg | ORAL_TABLET | Freq: Every day | ORAL | 3 refills | Status: DC

## 2023-03-30 NOTE — Telephone Encounter (Signed)
Patient notified and verbalized understanding. 

## 2023-03-30 NOTE — Telephone Encounter (Signed)
-----   Message from Ellsworth Lennox sent at 03/29/2023  9:04 AM EST ----- Please let the patient know her event monitor showed she is in atrial fibrillation 100% of the time. Average HR was at 96 beats per minute which is at the higher end of normal. I would recommend increasing her Toprol-XL from 100mg  daily to 125mg  daily (can continue 100mg  tablets and will need 25mg  tablets sent in) as this is likely playing a role in the her ejection fraction being mildly reduced.

## 2023-04-17 ENCOUNTER — Other Ambulatory Visit: Payer: Self-pay | Admitting: Cardiology

## 2023-04-26 ENCOUNTER — Other Ambulatory Visit: Payer: Self-pay | Admitting: Family Medicine

## 2023-04-26 DIAGNOSIS — E039 Hypothyroidism, unspecified: Secondary | ICD-10-CM

## 2023-04-30 ENCOUNTER — Ambulatory Visit (INDEPENDENT_AMBULATORY_CARE_PROVIDER_SITE_OTHER): Payer: Medicare Other

## 2023-04-30 VITALS — BP 130/82 | HR 76 | Ht 63.0 in | Wt 199.8 lb

## 2023-04-30 DIAGNOSIS — Z0000 Encounter for general adult medical examination without abnormal findings: Secondary | ICD-10-CM

## 2023-04-30 NOTE — Patient Instructions (Signed)
Sue Andrews , Thank you for taking time to come for your Medicare Wellness Visit. I appreciate your ongoing commitment to your health goals. Please review the following plan we discussed and let me know if I can assist you in the future.   Referrals/Orders/Follow-Ups/Clinician Recommendations:  Next Medicare Annual Wellness Visit: May 05, 2024 at 11:20 am virtual visit  This is a list of the screening recommended for you and due dates:  Health Maintenance  Topic Date Due   Zoster (Shingles) Vaccine (1 of 2) 02/08/1993   Complete foot exam   01/02/2023   COVID-19 Vaccine (5 - 2024-25 season) 02/18/2023   Yearly kidney function blood test for diabetes  05/27/2023   Hemoglobin A1C  05/27/2023   Eye exam for diabetics  05/29/2023   DEXA scan (bone density measurement)  11/05/2023   Yearly kidney health urinalysis for diabetes  11/24/2023   Medicare Annual Wellness Visit  04/29/2024   DTaP/Tdap/Td vaccine (2 - Tdap) 07/24/2031   Pneumonia Vaccine  Completed   Flu Shot  Completed   HPV Vaccine  Aged Out   Hepatitis C Screening  Discontinued    Advanced directives: (ACP Link)Information on Advanced Care Planning can be found at [facility] Advance Health Care Directives Advance Health Care Directives ([email or web address]   Next Medicare Annual Wellness Visit scheduled for next year: yes  Preventive Care 65 Years and Older, Female Preventive care refers to lifestyle choices and visits with your health care provider that can promote health and wellness. Preventive care visits are also called wellness exams. What can I expect for my preventive care visit? Counseling Your health care provider may ask you questions about your: Medical history, including: Past medical problems. Family medical history. Pregnancy and menstrual history. History of falls. Current health, including: Memory and ability to understand (cognition). Emotional well-being. Home life and  relationship well-being. Sexual activity and sexual health. Lifestyle, including: Alcohol, nicotine or tobacco, and drug use. Access to firearms. Diet, exercise, and sleep habits. Work and work Astronomer. Sunscreen use. Safety issues such as seatbelt and bike helmet use. Physical exam Your health care provider will check your: Height and weight. These may be used to calculate your BMI (body mass index). BMI is a measurement that tells if you are at a healthy weight. Waist circumference. This measures the distance around your waistline. This measurement also tells if you are at a healthy weight and may help predict your risk of certain diseases, such as type 2 diabetes and high blood pressure. Heart rate and blood pressure. Body temperature. Skin for abnormal spots. What immunizations do I need?  Vaccines are usually given at various ages, according to a schedule. Your health care provider will recommend vaccines for you based on your age, medical history, and lifestyle or other factors, such as travel or where you work. What tests do I need? Screening Your health care provider may recommend screening tests for certain conditions. This may include: Lipid and cholesterol levels. Hepatitis C test. Hepatitis B test. HIV (human immunodeficiency virus) test. STI (sexually transmitted infection) testing, if you are at risk. Lung cancer screening. Colorectal cancer screening. Diabetes screening. This is done by checking your blood sugar (glucose) after you have not eaten for a while (fasting). Mammogram. Talk with your health care provider about how often you should have regular mammograms. BRCA-related cancer screening. This may be done if you have a family history of breast, ovarian, tubal, or peritoneal cancers. Bone density scan. This is  done to screen for osteoporosis. Talk with your health care provider about your test results, treatment options, and if necessary, the need for more  tests. Follow these instructions at home: Eating and drinking  Eat a diet that includes fresh fruits and vegetables, whole grains, lean protein, and low-fat dairy products. Limit your intake of foods with high amounts of sugar, saturated fats, and salt. Take vitamin and mineral supplements as recommended by your health care provider. Do not drink alcohol if your health care provider tells you not to drink. If you drink alcohol: Limit how much you have to 0-1 drink a day. Know how much alcohol is in your drink. In the [place] one drink equals one 12 oz bottle of beer (355 mL), one 5 oz glass of wine (148 mL), or one 1 oz glass of hard liquor (44 mL). Lifestyle Brush your teeth every morning and night with fluoride toothpaste. Floss one time each day. Exercise for at least 30 minutes 5 or more days each week. Do not use any products that contain nicotine or tobacco. These products include cigarettes, chewing tobacco, and vaping devices, such as e-cigarettes. If you need help quitting, ask your health care provider. Do not use drugs. If you are sexually active, practice safe sex. Use a condom or other form of protection in order to prevent STIs. Take aspirin only as told by your health care provider. Make sure that you understand how much to take and what form to take. Work with your health care provider to find out whether it is safe and beneficial for you to take aspirin daily. Ask your health care provider if you need to take a cholesterol-lowering medicine (statin). Find healthy ways to manage stress, such as: Meditation, yoga, or listening to music. Journaling. Talking to a trusted person. Spending time with friends and family. Minimize exposure to UV radiation to reduce your risk of skin cancer. Safety Always wear your seat belt while driving or riding in a vehicle. Do not drive: If you have been drinking alcohol. Do not ride with someone who has been drinking. When you are tired or  distracted. While texting. If you have been using any mind-altering substances or drugs. Wear a helmet and other protective equipment during sports activities. If you have firearms in your house, make sure you follow all gun safety procedures. What's next? Visit your health care provider once a year for an annual wellness visit. Ask your health care provider how often you should have your eyes and teeth checked. Stay up to date on all vaccines. This information is not intended to replace advice given to you by your health care provider. Make sure you discuss any questions you have with your health care provider. Document Revised: 09/18/2020 Document Reviewed: 09/18/2020 Elsevier Patient Education  2024 [employer].  Understanding Your Risk for Falls Millions of people have serious injuries from falls each year. It is important to understand your risk of falling. Talk with your health care provider about your risk and what you can do to lower it. If you do have a serious fall, make sure to tell your provider. Falling once raises your risk of falling again. How can falls affect me? Serious injuries from falls are common. These include: Broken bones, such as hip fractures. Head injuries, such as traumatic brain injuries (TBI) or concussions. A fear of falling can cause you to avoid activities and stay at home. This can make your muscles weaker and raise your risk for a  fall. What can increase my risk? There are a number of risk factors that increase your risk for falling. The more risk factors you have, the higher your risk of falling. Serious injuries from a fall happen most often to people who are older than 81 years old. Teenagers and young adults ages 2-29 are also at higher risk. Common risk factors include: Weakness in the lower body. Being generally weak or confused due to long-term (chronic) illness. Dizziness or balance problems. Poor vision. Medicines that cause dizziness or  drowsiness. These may include: Medicines for your blood pressure, heart, anxiety, insomnia, or swelling (edema). Pain medicines. Muscle relaxants. Other risk factors include: Drinking alcohol. Having had a fall in the past. Having foot pain or wearing improper footwear. Working at a dangerous job. Having any of the following in your home: Tripping hazards, such as floor clutter or loose rugs. Poor lighting. Pets. Having dementia or memory loss. What actions can I take to lower my risk of falling?     Physical activity Stay physically fit. Do strength and balance exercises. Consider taking a regular class to build strength and balance. Yoga and tai chi are good options. Vision Have your eyes checked every year and your prescription for glasses or contacts updated as needed. Shoes and walking aids Wear non-skid shoes. Wear shoes that have rubber soles and low heels. Do not wear high heels. Do not walk around the house in socks or slippers. Use a cane or walker as told by your provider. Home safety Attach secure railings on both sides of your stairs. Install grab bars for your bathtub, shower, and toilet. Use a non-skid mat in your bathtub or shower. Attach bath mats securely with double-sided, non-slip rug tape. Use good lighting in all rooms. Keep a flashlight near your bed. Make sure there is a clear path from your bed to the bathroom. Use night-lights. Do not use throw rugs. Make sure all carpeting is taped or tacked down securely. Remove all clutter from walkways and stairways, including extension cords. Repair uneven or broken steps and floors. Avoid walking on icy or slippery surfaces. Walk on the grass instead of on icy or slick sidewalks. Use ice melter to get rid of ice on walkways in the winter. Use a cordless phone. Questions to ask your health care provider Can you help me check my risk for a fall? Do any of my medicines make me more likely to fall? Should I take a  vitamin D supplement? What exercises can I do to improve my strength and balance? Should I make an appointment to have my vision checked? Do I need a bone density test to check for weak bones (osteoporosis)? Would it help to use a cane or a walker? Where to find more information Centers for Disease Control and Prevention, STEADI: TonerPromos.no Community-Based Fall Prevention Programs: TonerPromos.no [employer] on Aging: BaseRingTones.pl Contact a health care provider if: You fall at home. You are afraid of falling at home. You feel weak, drowsy, or dizzy. This information is not intended to replace advice given to you by your health care provider. Make sure you discuss any questions you have with your health care provider. Document Revised: 11/24/2021 Document Reviewed: 11/24/2021 Elsevier Patient Education  2024 [employer].

## 2023-04-30 NOTE — Progress Notes (Signed)
Please attest and cosign this visit due to patients primary care provider not being in the office at the time the visit was completed.   Because this visit was a virtual/telehealth visit,  certain criteria was not obtained, such a blood pressure, CBG if applicable, and timed get up and go. Any medications not marked as "taking" were not mentioned during the medication reconciliation part of the visit. Any vitals not documented were not able to be obtained due to this being a telehealth visit or patient was unable to self-report a recent blood pressure reading due to a lack of equipment at home via telehealth. Vitals that have been documented are verbally provided by the patient.  Interactive audio and video telecommunications were attempted between this provider and patient, however failed, due to patient having technical difficulties OR patient did not have access to video capability.  We continued and completed visit with audio only.  Subjective:   Sue Andrews is a 83 y.o. female who presents for Medicare Annual (Subsequent) preventive examination.  Visit Complete: Virtual I connected with  [name] on 04/30/23 by a audio enabled telemedicine application and verified that I am speaking with the correct person using two identifiers.  Patient Location: Home  Provider Location: Home Office  I discussed the limitations of evaluation and management by telemedicine. The patient expressed understanding and agreed to proceed.  Vital Signs: Because this visit was a virtual/telehealth visit, some criteria may be missing or patient reported. Any vitals not documented were not able to be obtained and vitals that have been documented are patient reported.  Patient Medicare AWV questionnaire was completed by the patient on na; I have confirmed that all information answered by patient is correct and no changes since this date.  Cardiac Risk Factors include: advanced age (>43men, >11  women);diabetes mellitus;dyslipidemia;hypertension;obesity (BMI >30kg/m2);sedentary lifestyle;Other (see comment), Risk factor comments: AFib     Objective:    Today's Vitals   04/30/23 1529  BP: 130/82  Pulse: 76  Weight: 199 lb 12.8 oz (90.6 kg)  Height: 5\' 3"  (1.6 m)   Body mass index is 35.39 kg/m.     04/30/2023    3:28 PM 04/18/2021    7:10 PM 11/13/2020    6:30 AM 11/04/2020    9:11 AM  Advanced Directives  Does Patient Have a Medical Advance Directive? No No No No  Would patient like information on creating a medical advance directive? No - Patient declined  No - Patient declined No - Patient declined    Current Medications (verified) Outpatient Encounter Medications as of 04/30/2023  Medication Sig   Aspirin 81 MG CAPS Take by mouth daily.   Calcium Carb-Cholecalciferol (CALCIUM 600+D3 PO) Take 1 tablet by mouth daily.   Cholecalciferol (VITAMIN D) 50 MCG (2000 UT) CAPS Take by mouth.   ELIQUIS 5 MG TABS tablet TAKE 1 TABLET BY MOUTH TWICE  DAILY   furosemide (LASIX) 20 MG tablet TAKE 1 TABLET BY MOUTH DAILY   Insulin Pen Needle (PEN NEEDLES) 32G X 4 MM MISC Use as directed   LANTUS SOLOSTAR 100 UNIT/ML Solostar Pen INJECT SUBCUTANEOUSLY 50 UNITS  DAILY   levothyroxine (SYNTHROID) 25 MCG tablet TAKE 1 TABLET BY MOUTH DAILY   losartan (COZAAR) 25 MG tablet TAKE 1 TABLET BY MOUTH DAILY   metoprolol succinate (TOPROL XL) 25 MG 24 hr tablet Take 1 tablet (25 mg total) by mouth daily. Take with 100 mg tablet once daily for a total of 125 mg daily.  metoprolol succinate (TOPROL-XL) 100 MG 24 hr tablet TAKE 1 TABLET BY MOUTH DAILY   montelukast (SINGULAIR) 10 MG tablet TAKE 1 TABLET BY MOUTH AT  BEDTIME   Multiple Vitamins-Minerals (MULTIVITAMIN WITH MINERALS) tablet Take 1 tablet by mouth daily. Centrum Silver   rosuvastatin (CRESTOR) 5 MG tablet TAKE 1 TABLET BY MOUTH DAILY   amoxicillin-clavulanate (AUGMENTIN) 875-125 MG tablet Take 1 tablet by mouth 2 (two) times daily.  (Patient not taking: Reported on 04/30/2023)   cyanocobalamin 1000 MCG tablet Take 1,000 mcg by mouth daily. (Patient not taking: Reported on 03/12/2023)   No facility-administered encounter medications on file as of 04/30/2023.    Allergies (verified) Patient has no known allergies.   History: Past Medical History:  Diagnosis Date   Coronary artery disease    a. s/p Synergy DES on 08/18/2019 in [place]   HFrEF (heart failure with reduced ejection fraction) (HCC)    a. EF 30% in 2021 b. EF at 40 to 45% in 10/2021   Hyperlipidemia    Hypertension    Hypothyroidism    PAF (paroxysmal atrial fibrillation) (HCC)    T2DM (type 2 diabetes mellitus) (HCC)    Past Surgical History:  Procedure Laterality Date   ABDOMINAL HYSTERECTOMY  1986   COLONOSCOPY     CORONARY ANGIOPLASTY WITH STENT PLACEMENT Right 08/18/2019   Synergy DES placed in [place]   HAMMER TOE SURGERY Left 2009   1st,2nd,3rd toes   TONSILLECTOMY     Family History  Problem Relation Age of Onset   Cancer Mother    Diabetes Father    Social History   Socioeconomic History   Marital status: Widowed    Spouse name: Not on file   Number of children: 2   Years of education: Not on file   Highest education level: Not on file  Occupational History   Not on file  Tobacco Use   Smoking status: Never   Smokeless tobacco: Never  Vaping Use   Vaping status: Never Used  Substance and Sexual Activity   Alcohol use: Not Currently   Drug use: Never   Sexual activity: Not on file  Other Topics Concern   Not on file  Social History Narrative   Lives with son   Social Drivers of Health   Financial Resource Strain: Low Risk  (04/30/2023)   Overall Financial Resource Strain (CARDIA)    Difficulty of Paying Living Expenses: Not hard at all  Food Insecurity: No Food Insecurity (04/30/2023)   Hunger Vital Sign    Worried About Running Out of Food in the Last Year: Never true    Ran Out of Food in the Last Year: Never  true  Transportation Needs: No Transportation Needs (04/30/2023)   PRAPARE - Administrator, Civil Service (Medical): No    Lack of Transportation (Non-Medical): No  Physical Activity: Sufficiently Active (04/30/2023)   Exercise Vital Sign    Days of Exercise per Week: 7 days    Minutes of Exercise per Session: 30 min  Stress: No Stress Concern Present (04/30/2023)   [employer] of Occupational Health - Occupational Stress Questionnaire    Feeling of Stress : Not at all  Social Connections: Moderately Isolated (04/30/2023)   Social Connection and Isolation Panel [NHANES]    Frequency of Communication with Friends and Family: More than three times a week    Frequency of Social Gatherings with Friends and Family: More than three times a week    Attends  Religious Services: More than 4 times per year    Active Member of Clubs or Organizations: No    Attends Banker Meetings: Never    Marital Status: Widowed    Tobacco Counseling Counseling given: Yes   Clinical Intake:  Pre-visit preparation completed: Yes  Pain : No/denies pain     BMI - recorded: 35.39 Nutritional Status: BMI > 30  Obese Nutritional Risks: None Diabetes: Yes CBG done?: No Did pt. bring in CBG monitor from home?: No  How often do you need to have someone help you when you read instructions, pamphlets, or other written materials from your doctor or pharmacy?: 1 - Never  Interpreter Needed?: No  Information entered by :: Abby W. CMA   Activities of Daily Living    04/30/2023    3:37 PM  In your present state of health, do you have any difficulty performing the following activities:  Hearing? 1  Comment wears hearing aids.  Vision? 0  Difficulty concentrating or making decisions? 0  Walking or climbing stairs? 0  Dressing or bathing? 0  Doing errands, shopping? 0  Comment patient only drives when she absolutely has to. Her son takes her if she has to go somewhere that  requires more driving. she does drive Printmaker and eating ? N  Using the Toilet? N  In the past six months, have you accidently leaked urine? N  Do you have problems with loss of bowel control? N  Managing your Medications? N  Managing your Finances? N  Housekeeping or managing your Housekeeping? N    Patient Care Team: Tommie Sams, DO as PCP - General (Family Medicine)  Indicate any recent Medical Services you may have received from other than [facility] providers in the past year (date may be approximate).     Assessment:   This is a routine wellness examination for [facility].  Hearing/Vision screen Hearing Screening - Comments:: Patient wears hearing aids.  She is up to date with yearly exams. She is looking for a new provider since she moved to [place]. She will call to find a provider.    Goals Addressed             This Visit's Progress    Patient Stated       Improve my balance       Depression Screen    04/30/2023    3:39 PM 02/23/2023    1:14 PM 11/24/2022    1:16 PM 05/26/2022    1:59 PM 01/01/2022    2:11 PM 11/26/2021    1:56 PM 07/23/2021    2:13 PM  PHQ 2/9 Scores  PHQ - 2 Score 0 0 0 0 0 0 0  PHQ- 9 Score 0  0 0       Fall Risk    04/30/2023    3:37 PM 02/23/2023    1:14 PM 11/24/2022    1:16 PM 01/01/2022    2:10 PM  Fall Risk   Falls in the past year? 0 0 0 1  Number falls in past yr: 0   0  Injury with Fall? 0     Risk for fall due to : History of fall(s);Impaired balance/gait;Impaired mobility No Fall Risks  Other (Comment)  Risk for fall due to: Comment    tripped over  vaccum cord  Follow up Education provided;Falls prevention discussed Falls evaluation completed  Falls evaluation completed    MEDICARE RISK AT HOME: Medicare Risk  at Home Any stairs in or around the home?: No If so, are there any without handrails?: No Home free of loose throw rugs in walkways, pet beds, electrical cords, etc?: Yes Adequate lighting in your  home to reduce risk of falls?: Yes Life alert?: No Use of a cane, walker or w/c?: Yes Grab bars in the bathroom?: Yes Shower chair or bench in shower?: No Elevated toilet seat or a handicapped toilet?: No  TIMED UP AND GO:  Was the test performed?  No    Cognitive Function:        04/30/2023    3:32 PM  6CIT Screen  What Year? 0 points  What month? 0 points  What time? 0 points  Count back from 20 0 points  Months in reverse 0 points  Repeat phrase 0 points  Total Score 0 points    Immunizations Immunization History  Administered Date(s) Administered   Influenza,inj,quad, With Preservative 12/06/2014, 02/05/2016, 12/30/2016   Influenza-Unspecified 12/06/2019, 12/07/2021   PFIZER Comirnaty(Gray Top)Covid-19 Tri-Sucrose Vaccine 05/18/2019, 06/08/2019, 01/28/2020   Pneumococcal Conjugate-13 12/27/2014   Pneumococcal Polysaccharide-23 04/06/2010, 01/05/2020   RSV,unspecified 04/29/2022   Td 07/23/2021   Varicella Zoster Immune Globulin 04/06/2009   Zoster, Live 04/07/2019   Zoster, Unspecified 01/05/2020    TDAP status: Up to date  Flu Vaccine status: Up to date  Pneumococcal vaccine status: Up to date  Covid-19 vaccine status: Completed vaccines  Qualifies for Shingles Vaccine? Yes   Zostavax completed Yes   Shingrix Completed?: No.    Education has been provided regarding the importance of this vaccine. Patient has been advised to call insurance company to determine out of pocket expense if they have not yet received this vaccine. Advised may also receive vaccine at local pharmacy or Health Dept. Verbalized acceptance and understanding.  Screening Tests Health Maintenance  Topic Date Due   Zoster Vaccines- Shingrix (1 of 2) 02/08/1993   COVID-19 Vaccine (4 - 2024-25 season) 12/06/2022   FOOT EXAM  01/02/2023   Medicare Annual Wellness (AWV)  01/02/2023   Diabetic kidney evaluation - eGFR measurement  05/27/2023   INFLUENZA VACCINE  07/05/2023 (Originally  11/05/2022)   HEMOGLOBIN A1C  05/27/2023   OPHTHALMOLOGY EXAM  05/29/2023   Diabetic kidney evaluation - Urine ACR  11/24/2023   DTaP/Tdap/Td (2 - Tdap) 07/24/2031   Pneumonia Vaccine 44+ Years old  Completed   DEXA SCAN  Completed   HPV VACCINES  Aged Out   Hepatitis C Screening  Discontinued    Health Maintenance  Health Maintenance Due  Topic Date Due   Zoster Vaccines- Shingrix (1 of 2) 02/08/1993   COVID-19 Vaccine (4 - 2024-25 season) 12/06/2022   FOOT EXAM  01/02/2023   Medicare Annual Wellness (AWV)  01/02/2023   Diabetic kidney evaluation - eGFR measurement  05/27/2023    Colorectal cancer screening: No longer required.   Mammogram status: No longer required due to  .  Bone Density status: Completed 11/04/2021. Results reflect: Bone density results: OSTEOPENIA. Repeat every 2 years.  Lung Cancer Screening: (Low Dose CT Chest recommended if Age 92-80 years, 20 pack-year currently smoking OR have quit w/in 15years.) does not qualify.   Lung Cancer Screening Referral: na  Additional Screening:  Hepatitis C Screening: does not qualify; Completed   Vision Screening: Recommended annual ophthalmology exams for early detection of glaucoma and other disorders of the eye. Is the patient up to date with their annual eye exam?  Yes  Who is the provider or  what is the name of the office in which the patient attends annual eye exams? [facility] If pt is not established with a provider, would they like to be referred to a provider to establish care? No .   Dental Screening: Recommended annual dental exams for proper oral hygiene  Diabetic Foot Exam: Diabetic Foot Exam: Overdue, Pt has been advised about the importance in completing this exam. Pt is scheduled for diabetic foot exam on provider notified.  Community Resource Referral / Chronic Care Management: CRR required this visit?  No   CCM required this visit?  No     Plan:     I have personally reviewed  and noted the following in the patient's chart:   Medical and social history Use of alcohol, tobacco or illicit drugs  Current medications and supplements including opioid prescriptions. Patient is not currently taking opioid prescriptions. Functional ability and status Nutritional status Physical activity Advanced directives List of other physicians Hospitalizations, surgeries, and ER visits in previous 12 months Vitals Screenings to include cognitive, depression, and falls Referrals and appointments  In addition, I have reviewed and discussed with patient certain preventive protocols, quality metrics, and best practice recommendations. A written personalized care plan for preventive services as well as general preventive health recommendations were provided to patient.     Jordan Hawks Camil Hausmann, CMA   04/30/2023   After Visit Summary: (MyChart) Due to this being a telephonic visit, the after visit summary with patients personalized plan was offered to patient via MyChart   Nurse Notes: na

## 2023-05-05 ENCOUNTER — Other Ambulatory Visit: Payer: Self-pay

## 2023-05-05 ENCOUNTER — Encounter: Payer: Self-pay | Admitting: Family Medicine

## 2023-05-05 DIAGNOSIS — E1142 Type 2 diabetes mellitus with diabetic polyneuropathy: Secondary | ICD-10-CM

## 2023-05-05 MED ORDER — PEN NEEDLES 32G X 4 MM MISC: 1 refills | Status: DC

## 2023-05-21 ENCOUNTER — Telehealth: Payer: Self-pay

## 2023-05-21 NOTE — Telephone Encounter (Signed)
Communication  Reason for CRM: Victorino Dike (cb number is [phone])  with [employer] called to leave a message. Patient's left lower extremity tested positive for mild PAD at 0.85. Right lower extremity tested positive for mild PAD at 0.76.

## 2023-05-24 ENCOUNTER — Other Ambulatory Visit: Payer: Self-pay

## 2023-05-24 DIAGNOSIS — I739 Peripheral vascular disease, unspecified: Secondary | ICD-10-CM

## 2023-07-01 ENCOUNTER — Other Ambulatory Visit: Payer: Self-pay | Admitting: Family Medicine

## 2023-07-02 ENCOUNTER — Other Ambulatory Visit: Payer: Self-pay

## 2023-07-02 MED ORDER — ELIQUIS 5 MG PO TABS: 5.0000 mg | ORAL_TABLET | Freq: Two times a day (BID) | ORAL | 2 refills | Status: DC

## 2023-07-02 MED ORDER — MONTELUKAST SODIUM 10 MG PO TABS: 10.0000 mg | ORAL_TABLET | Freq: Every day | ORAL | 2 refills | Status: DC

## 2023-07-02 MED ORDER — ROSUVASTATIN CALCIUM 5 MG PO TABS: 5.0000 mg | ORAL_TABLET | Freq: Every day | ORAL | 2 refills | Status: DC

## 2023-07-02 MED ORDER — LOSARTAN POTASSIUM 25 MG PO TABS: 25.0000 mg | ORAL_TABLET | Freq: Every day | ORAL | 2 refills | Status: DC

## 2023-07-19 ENCOUNTER — Other Ambulatory Visit: Payer: Self-pay

## 2023-07-19 DIAGNOSIS — I739 Peripheral vascular disease, unspecified: Secondary | ICD-10-CM

## 2023-08-10 ENCOUNTER — Encounter: Payer: Medicare Other | Admitting: Vascular Surgery

## 2023-08-10 ENCOUNTER — Encounter: Admitting: Vascular Surgery

## 2023-08-10 ENCOUNTER — Encounter

## 2023-08-24 ENCOUNTER — Encounter: Payer: Self-pay | Admitting: Family Medicine

## 2023-08-24 ENCOUNTER — Ambulatory Visit (INDEPENDENT_AMBULATORY_CARE_PROVIDER_SITE_OTHER): Payer: Medicare Other | Admitting: Family Medicine

## 2023-08-24 VITALS — BP 136/83 | HR 104 | Temp 97.4°F | Ht 63.0 in | Wt 205.0 lb

## 2023-08-24 DIAGNOSIS — I10 Essential (primary) hypertension: Secondary | ICD-10-CM

## 2023-08-24 DIAGNOSIS — E1169 Type 2 diabetes mellitus with other specified complication: Secondary | ICD-10-CM | POA: Diagnosis not present

## 2023-08-24 DIAGNOSIS — E039 Hypothyroidism, unspecified: Secondary | ICD-10-CM

## 2023-08-24 DIAGNOSIS — E1142 Type 2 diabetes mellitus with diabetic polyneuropathy: Secondary | ICD-10-CM

## 2023-08-24 DIAGNOSIS — Z7901 Long term (current) use of anticoagulants: Secondary | ICD-10-CM | POA: Diagnosis not present

## 2023-08-24 DIAGNOSIS — I5020 Unspecified systolic (congestive) heart failure: Secondary | ICD-10-CM

## 2023-08-24 DIAGNOSIS — E785 Hyperlipidemia, unspecified: Secondary | ICD-10-CM | POA: Diagnosis not present

## 2023-08-24 DIAGNOSIS — I4891 Unspecified atrial fibrillation: Secondary | ICD-10-CM

## 2023-08-24 DIAGNOSIS — I739 Peripheral vascular disease, unspecified: Secondary | ICD-10-CM

## 2023-08-24 NOTE — Patient Instructions (Signed)
 Labs ordered.  Follow up in 6 months.   Vascular US  ABI.

## 2023-08-24 NOTE — Assessment & Plan Note (Signed)
LDL at goal on Crestor.  Continue.

## 2023-08-24 NOTE — Assessment & Plan Note (Signed)
 Well-controlled on Lasix , metoprolol , and losartan .

## 2023-08-24 NOTE — Assessment & Plan Note (Signed)
 Edema noted today.  However, she states that this is her baseline.  Continue current medications.

## 2023-08-24 NOTE — Assessment & Plan Note (Signed)
 Stable.  Continue Eliquis and metoprolol

## 2023-08-24 NOTE — Assessment & Plan Note (Signed)
 Patient currently asymptomatic.  We discussed proceeding with ABI.  Patient wants to wait.

## 2023-08-24 NOTE — Assessment & Plan Note (Signed)
 Stable.  Labs today.  Continue Lantus .

## 2023-08-24 NOTE — Progress Notes (Signed)
 Subjective:  Patient ID: Sue Andrews, female    DOB: February 15, 1941  Age: 83 y.o. MRN: [id number]  CC:   Chief Complaint  Patient presents with   Follow-up    6 month f/u hypertension, DM2, Afib, hyperlipidemia     HPI:  83 year old female with below mentioned medical problems presents for follow-up.  Patient's hypertension is well-controlled on losartan , metoprolol , and Lasix .  Follows closely with cardiology regarding heart failure.  Compliant with current medications.  Most recent EF 45 to 50%.  Patient has known chronic atrial fibrillation.  She is compliant with metoprolol  and Eliquis .  Reports that she recently had a screening at home for PAD (Quantaflo).  She would like to discuss this today.  She is currently asymptomatic and has no symptoms of claudication.   Patient Active Problem List   Diagnosis Date Noted   Peripheral artery disease (HCC) 08/24/2023   Balance problem 11/24/2022   Essential hypertension 05/27/2022   Controlled type 2 diabetes mellitus with diabetic polyneuropathy, without long-term current use of insulin  (HCC) 05/27/2022   Atrial fibrillation (HCC) 09/24/2021   HFrEF (heart failure with reduced ejection fraction) (HCC) 09/24/2021   Hyperlipidemia associated with type 2 diabetes mellitus (HCC) 09/24/2021   Coronary artery disease of native artery of native heart with stable angina pectoris (HCC) 09/24/2021    Social Hx   Social History   Socioeconomic History   Marital status: Widowed    Spouse name: Not on file   Number of children: 2   Years of education: Not on file   Highest education level: Not on file  Occupational History   Not on file  Tobacco Use   Smoking status: Never   Smokeless tobacco: Never  Vaping Use   Vaping status: Never Used  Substance and Sexual Activity   Alcohol use: Not Currently   Drug use: Never   Sexual activity: Not on file  Other Topics Concern   Not on file  Social History Narrative   Lives with son    Social Drivers of Health   Financial Resource Strain: Low Risk  (04/30/2023)   Overall Financial Resource Strain (CARDIA)    Difficulty of Paying Living Expenses: Not hard at all  Food Insecurity: No Food Insecurity (04/30/2023)   Hunger Vital Sign    Worried About Running Out of Food in the Last Year: Never true    Ran Out of Food in the Last Year: Never true  Transportation Needs: No Transportation Needs (04/30/2023)   PRAPARE - Administrator, Civil Service (Medical): No    Lack of Transportation (Non-Medical): No  Physical Activity: Sufficiently Active (04/30/2023)   Exercise Vital Sign    Days of Exercise per Week: 7 days    Minutes of Exercise per Session: 30 min  Stress: No Stress Concern Present (04/30/2023)   [employer] of Occupational Health - Occupational Stress Questionnaire    Feeling of Stress : Not at all  Social Connections: Moderately Isolated (04/30/2023)   Social Connection and Isolation Panel [NHANES]    Frequency of Communication with Friends and Family: More than three times a week    Frequency of Social Gatherings with Friends and Family: More than three times a week    Attends Religious Services: More than 4 times per year    Active Member of [employer] or Organizations: No    Attends Banker Meetings: Never    Marital Status: Widowed    Review of Systems Per HPI  Objective:  BP 136/83   Pulse (!) 104   Temp (!) 97.4 F (36.3 C)   Ht 5\' 3"  (1.6 m)   Wt 205 lb (93 kg)   SpO2 96%   BMI 36.31 kg/m      08/24/2023    1:03 PM 04/30/2023    3:29 PM 03/12/2023   12:57 PM  BP/Weight  Systolic BP 136 130 122  Diastolic BP 83 82 68  Wt. (Lbs) 205 199.8 206.6  BMI 36.31 kg/m2 35.39 kg/m2 36.6 kg/m2    Physical Exam Vitals and nursing note reviewed.  Constitutional:      General: She is not in acute distress.    Appearance: Normal appearance. She is obese.  HENT:     Head: Normocephalic and atraumatic.  Eyes:      General:        Right eye: No discharge.        Left eye: No discharge.     Conjunctiva/sclera: Conjunctivae normal.  Cardiovascular:     Comments: Irregular irregular. Significant lower extremity edema. Pulmonary:     Effort: Pulmonary effort is normal.     Breath sounds: Normal breath sounds. No wheezing, rhonchi or rales.  Neurological:     Mental Status: She is alert.  Psychiatric:        Mood and Affect: Mood normal.        Behavior: Behavior normal.     Lab Results  Component Value Date   WBC 5.7 05/26/2022   HGB 14.6 05/26/2022   HCT 43.0 05/26/2022   PLT 166 05/26/2022   GLUCOSE 82 05/26/2022   CHOL 147 05/26/2022   TRIG 130 05/26/2022   HDL 70 05/26/2022   LDLCALC 55 05/26/2022   ALT 27 05/26/2022   AST 31 05/26/2022   NA 141 05/26/2022   K 4.0 05/26/2022   CL 102 05/26/2022   CREATININE 0.88 05/26/2022   BUN 26 05/26/2022   CO2 25 05/26/2022   TSH 2.010 05/26/2022   INR 1.2 04/18/2021   HGBA1C 6.8 (H) 11/24/2022     Assessment & Plan:  Controlled type 2 diabetes mellitus with diabetic polyneuropathy, without long-term current use of insulin  (HCC) Assessment & Plan: Stable.  Labs today.  Continue Lantus .  Orders: -     CMP14+EGFR -     Hemoglobin A1c -     Microalbumin / creatinine urine ratio  Hyperlipidemia associated with type 2 diabetes mellitus (HCC) Assessment & Plan: LDL at goal on Crestor .  Continue.  Orders: -     Lipid panel  Hypothyroidism, unspecified type -     TSH  Chronic anticoagulation -     CBC  Atrial fibrillation, unspecified type (HCC) Assessment & Plan: Stable.  Continue Eliquis  and metoprolol .   HFrEF (heart failure with reduced ejection fraction) (HCC) Assessment & Plan: Edema noted today.  However, she states that this is her baseline.  Continue current medications.   Essential hypertension Assessment & Plan: Well-controlled on Lasix , metoprolol , and losartan .   Peripheral artery disease  [facility]) Assessment & Plan: Patient currently asymptomatic.  We discussed proceeding with ABI.  Patient wants to wait.     Follow-up:  6 months  Christoper Bushey Debrah Fan DO [facility]

## 2023-08-25 LAB — CMP14+EGFR
ALT: 26 IU/L (ref 0–32)
AST: 31 IU/L (ref 0–40)
Albumin: 4.2 g/dL (ref 3.8–4.8)
Alkaline Phosphatase: 99 IU/L (ref 44–121)
BUN/Creatinine Ratio: 31 — ABNORMAL HIGH (ref 12–28)
BUN: 21 mg/dL (ref 8–27)
Bilirubin Total: 0.8 mg/dL (ref 0.0–1.2)
CO2: 24 mmol/L (ref 20–29)
Calcium: 9.7 mg/dL (ref 8.7–10.3)
Chloride: 99 mmol/L (ref 96–106)
Creatinine, Ser: 0.68 mg/dL (ref 0.57–1.00)
Globulin, Total: 2.4 g/dL (ref 1.5–4.5)
Glucose: 157 mg/dL — ABNORMAL HIGH (ref 70–99)
Potassium: 3.7 mmol/L (ref 3.5–5.2)
Sodium: 140 mmol/L (ref 134–144)
Total Protein: 6.6 g/dL (ref 6.0–8.5)
eGFR: 88 mL/min/{1.73_m2} (ref >59)

## 2023-08-25 LAB — LIPID PANEL
Chol/HDL Ratio: 2.2 ratio (ref 0.0–4.4)
Cholesterol, Total: 144 mg/dL (ref 100–199)
HDL: 66 mg/dL (ref >39)
LDL Chol Calc (NIH): 58 mg/dL (ref 0–99)
Triglycerides: 113 mg/dL (ref 0–149)
VLDL Cholesterol Cal: 20 mg/dL (ref 5–40)

## 2023-08-25 LAB — HEMOGLOBIN A1C
Est. average glucose Bld gHb Est-mCnc: 148 mg/dL
Hgb A1c MFr Bld: 6.8 % — ABNORMAL HIGH (ref 4.8–5.6)

## 2023-08-25 LAB — MICROALBUMIN / CREATININE URINE RATIO
Creatinine, Urine: 41.1 mg/dL
Microalb/Creat Ratio: 23 mg/g{creat} (ref 0–29)
Microalbumin, Urine: 9.5 ug/mL

## 2023-08-25 LAB — CBC
Hematocrit: 44.5 % (ref 34.0–46.6)
Hemoglobin: 14.6 g/dL (ref 11.1–15.9)
MCH: 33.1 pg — ABNORMAL HIGH (ref 26.6–33.0)
MCHC: 32.8 g/dL (ref 31.5–35.7)
MCV: 101 fL — ABNORMAL HIGH (ref 79–97)
Platelets: 165 10*3/uL (ref 150–450)
RBC: 4.41 x10E6/uL (ref 3.77–5.28)
RDW: 12.6 % (ref 11.7–15.4)
WBC: 4.8 10*3/uL (ref 3.4–10.8)

## 2023-08-25 LAB — TSH: TSH: 2 u[IU]/mL (ref 0.450–4.500)

## 2023-08-30 ENCOUNTER — Ambulatory Visit: Payer: Self-pay | Admitting: Family Medicine

## 2023-09-02 ENCOUNTER — Other Ambulatory Visit: Payer: Self-pay | Admitting: Family Medicine

## 2023-09-02 DIAGNOSIS — D7589 Other specified diseases of blood and blood-forming organs: Secondary | ICD-10-CM

## 2023-09-07 DIAGNOSIS — D7589 Other specified diseases of blood and blood-forming organs: Secondary | ICD-10-CM | POA: Diagnosis not present

## 2023-09-08 ENCOUNTER — Ambulatory Visit: Payer: Self-pay | Admitting: Family Medicine

## 2023-09-08 LAB — VITAMIN B12: Vitamin B-12: >2000 pg/mL — ABNORMAL HIGH (ref 232–1245)

## 2023-09-08 LAB — FOLATE: Folate: >20 ng/mL (ref >3.0)

## 2023-10-15 ENCOUNTER — Encounter: Payer: Self-pay | Admitting: Family Medicine

## 2023-10-15 DIAGNOSIS — E1142 Type 2 diabetes mellitus with diabetic polyneuropathy: Secondary | ICD-10-CM

## 2023-10-15 MED ORDER — PEN NEEDLES 32G X 4 MM MISC: 1 refills | Status: AC

## 2023-11-03 ENCOUNTER — Other Ambulatory Visit: Payer: Self-pay | Admitting: Family Medicine

## 2023-11-11 LAB — HM DIABETES EYE EXAM

## 2023-12-06 ENCOUNTER — Other Ambulatory Visit: Payer: Self-pay | Admitting: Student

## 2023-12-22 ENCOUNTER — Other Ambulatory Visit: Payer: Self-pay | Admitting: Family Medicine

## 2023-12-22 DIAGNOSIS — E039 Hypothyroidism, unspecified: Secondary | ICD-10-CM

## 2023-12-27 NOTE — Progress Notes (Unsigned)
 Cardiology Office Note    Date:  12/29/2023  ID:  Sue Andrews, DOB Dec 03, 1940, MRN [id number] Cardiologist: Previously evaluated by Dr. Santo --> prefers to follow-up in [facility]     Cardiology APP:  Johnson Laymon HERO, PA-C { :  History of Present Illness:    Sue Andrews is a 83 y.o. 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  She was last examined by myself in 03/2023 and reported overall feeling well and denied any recent chest pain or dyspnea on exertion. Recent echocardiogram had shown her EF had improved to 45 to 50% and she did have mild mitral valve regurgitation. The options of possibly adding an SGLT2 inhibitor were reviewed but she wished to continue her current medications at that time. Was continued on Losartan  25 mg daily, Toprol -XL 100 mg daily, Lasix  20 mg daily, Crestor  5 mg daily and Eliquis  5 mg twice daily. She did wear a Zio patch in the interim which showed permanent atrial fibrillation with 100% burden and average heart rate was 96 bpm. She did have 1 run of NSVT and less than 1% PAC and PVC burden. Was recommend to titrate Toprol -XL to 125 mg daily as tachycardia was felt to possibly be contributing to her mildly reduced EF.  In talking with the patient today, she reports things have been stable since her last office visit. She denies any recent chest pain or palpitations. She does have baseline dyspnea but no acute changes in this. Does not exercise routinely but still does her own grocery shopping and enjoys participating in a book club with her friends. No specific orthopnea, PND or pitting edema. Remains on Eliquis   for anticoagulation with no reports of active bleeding. Checks her heart rate at home and says it is typically less than 100 but is unable to recall specific values. Says blood pressure has overall been well-controlled when checked at home as well.  Studies Reviewed:   EKG: EKG is ordered today and demonstrates:   EKG Interpretation Date/Time:  Wednesday December 29 2023 14:00:27 EDT Ventricular Rate:  100 PR Interval:    QRS Duration:  112 QT Interval:  346 QTC Calculation: 446 R Axis:   -67  Text Interpretation: Atrial fibrillation Left anterior fascicular block Confirmed by Johnson Laymon ([id number]) on 12/29/2023 2:08:22 PM       Echocardiogram: 03/2023 1. LVEF assessment difficult in setting of afib with elevated rates. .  Left ventricular ejection fraction, by estimation, is 45 to 50%. The left  ventricle has mildly decreased function. The left ventricle demonstrates  global hypokinesis. Left  ventricular diastolic parameters are indeterminate.   2. Right ventricular systolic function is normal. The right ventricular  size is normal. There is normal pulmonary artery systolic pressure.   3. Left atrial size was severely dilated.   4. The mitral valve is abnormal. Mild mitral valve regurgitation.   5. The tricuspid valve is abnormal.   6. The aortic valve is tricuspid. Aortic valve regurgitation is not  visualized. No aortic stenosis is present.   7. The inferior vena cava is normal in size with greater than 50%  respiratory variability, suggesting right atrial pressure of 3 mmHg.  8. Afib throughout study 90s to 120s. May require additional rate  control. Consider limited repeat echo when better rate controlled.    Event Monitor: 03/2023    Permanent atrial fibrillation (100% burden).  Average rate 96.  One 7 beat run of NSVT.  Isolated PACs were rare (<1.0%).  Isolated PVCs were rare (<1.0%).   Permanent atrial fibrillation- asymptomatic.    CHA2DS2-VASc Score  = 7  This indicates a 11.2% annual risk of stroke. The patient's score is based upon: CHF History: 1 HTN History: 1 Diabetes History: 1 Stroke History: 0 Vascular Disease History: 1 Age Score: 2 Gender Score: 1   Physical Exam:   VS:  BP 136/72 (BP Location: Right Arm, Cuff Size: Large)   Pulse 100   Ht 5' 3 (1.6 m)   Wt 205 lb (93 kg)   SpO2 95%   BMI 36.31 kg/m    Wt Readings from Last 3 Encounters:  12/29/23 205 lb (93 kg)  08/24/23 205 lb (93 kg)  04/30/23 199 lb 12.8 oz (90.6 kg)     GEN: Pleasant, elderly female appearing in no acute distress NECK: No JVD; No carotid bruits CARDIAC: Irregular irregular, no murmurs, rubs, gallops RESPIRATORY:  Clear to auscultation without rales, wheezing or rhonchi  ABDOMEN: Appears non-distended. No obvious abdominal masses. EXTREMITIES: No clubbing or cyanosis. Trace lower extremity edema bilaterally.  Distal pedal pulses are 2+ bilaterally.   Assessment and Plan:   1. Coronary artery disease involving native coronary artery of native heart without angina pectoris - She previously underwent DES placement in 2021 while living in [place]  and details from this are unavailable. She remains active at baseline for her age and denies any recent angina.  - She is not on ASA given the need for anticoagulation. Continue Toprol -XL 125 mg daily and Crestor  5 mg daily.  2. Heart failure with improved ejection fraction (HFimpEF) (HCC) - Her ejection fraction was previously as low as 30% in 2021 and had improved to 45 to 50% by most recent echocardiogram in 03/2023. She appears euvolemic by examination today and denies any acute changes in her respiratory status. Continue Lasix  20 mg daily, Losartan  25 mg daily and Toprol -XL 125 mg daily. Previously not interested in SGLT2 inhibitor therapy since overall doing well.  3. Longstanding persistent atrial fibrillation (HCC) - Her heart rate was initially recorded at 100 bpm and she says this has  overall been well-controlled when checked at home and is typically in the double digits but unable to recall specific values. Will continue Toprol -XL 125 mg daily for rate-control. - No reports of active bleeding. Continue Eliquis  5 mg twice daily for anticoagulation which is the appropriate dose given her current age, weight and renal function (creatinine at 0.68 when checked in 08/2023). CBC in 08/2023 showed her hemoglobin was stable at 14.6 with platelets at 165 K.   4. Essential hypertension - Blood pressure is at 136/72 during today's visit. Will continue current medical therapy with Lasix  20 mg daily, Losartan  25 mg daily and Toprol -XL 125 mg daily.  5. Hyperlipidemia LDL goal <70 - FLP in 08/2023 showed total cholesterol 144, triglycerides 113, HDL 66 and LDL 58. Continue current medical therapy with Crestor  5 mg daily.  6. Mitral Regurgitation - This was mild by echocardiogram in 03/2023. Can obtain repeat imaging in 2 to 3 years for reassessment unless clinically indicated in the interim.   Signed, Laymon CHRISTELLA Qua, PA-C

## 2023-12-29 ENCOUNTER — Encounter: Payer: Self-pay | Admitting: Student

## 2023-12-29 ENCOUNTER — Ambulatory Visit: Attending: Student | Admitting: Student

## 2023-12-29 VITALS — BP 136/72 | HR 100 | Ht 63.0 in | Wt 205.0 lb

## 2023-12-29 DIAGNOSIS — I4811 Longstanding persistent atrial fibrillation: Secondary | ICD-10-CM

## 2023-12-29 DIAGNOSIS — I10 Essential (primary) hypertension: Secondary | ICD-10-CM

## 2023-12-29 DIAGNOSIS — I5032 Chronic diastolic (congestive) heart failure: Secondary | ICD-10-CM | POA: Diagnosis not present

## 2023-12-29 DIAGNOSIS — E785 Hyperlipidemia, unspecified: Secondary | ICD-10-CM | POA: Diagnosis not present

## 2023-12-29 DIAGNOSIS — I340 Nonrheumatic mitral (valve) insufficiency: Secondary | ICD-10-CM | POA: Diagnosis not present

## 2023-12-29 DIAGNOSIS — I2510 Atherosclerotic heart disease of native coronary artery without angina pectoris: Secondary | ICD-10-CM | POA: Diagnosis not present

## 2023-12-29 NOTE — Patient Instructions (Signed)
 Medication Instructions:  Your physician recommends that you continue on your current medications as directed. Please refer to the Current Medication list given to you today.  *If you need a refill on your cardiac medications before your next appointment, please call your pharmacy*  Lab Work: NONE   If you have labs (blood work) drawn today and your tests are completely normal, you will receive your results only by: MyChart Message (if you have MyChart) OR A paper copy in the mail If you have any lab test that is abnormal or we need to change your treatment, we will call you to review the results.  Testing/Procedures: None  Follow-Up: At [facility], you and your health needs are our priority.  As part of our continuing mission to provide you with exceptional heart care, our providers are all part of one team.  This team includes your primary Cardiologist (physician) and Advanced Practice Providers or APPs (Physician Assistants and Nurse Practitioners) who all work together to provide you with the care you need, when you need it.  Your next appointment:   6 month(s)  Provider:   Laymon Qua, PA-C    We recommend signing up for the patient portal called MyChart.  Sign up information is provided on this After Visit Summary.  MyChart is used to connect with patients for Virtual Visits (Telemedicine).  Patients are able to view lab/test results, encounter notes, upcoming appointments, etc.  Non-urgent messages can be sent to your provider as well.   To learn more about what you can do with MyChart, go to [email or web address].   Other Instructions Thank you for choosing [facility]!

## 2024-01-01 ENCOUNTER — Encounter: Payer: Self-pay | Admitting: Family Medicine

## 2024-01-03 ENCOUNTER — Other Ambulatory Visit: Payer: Self-pay | Admitting: Family Medicine

## 2024-01-03 DIAGNOSIS — E118 Type 2 diabetes mellitus with unspecified complications: Secondary | ICD-10-CM | POA: Insufficient documentation

## 2024-01-03 MED ORDER — GLUCOSE BLOOD VI STRP: ORAL_STRIP | 12 refills | Status: AC

## 2024-01-04 ENCOUNTER — Telehealth: Payer: Self-pay | Admitting: *Deleted

## 2024-01-04 ENCOUNTER — Other Ambulatory Visit: Payer: Self-pay | Admitting: Cardiology

## 2024-01-04 NOTE — Telephone Encounter (Unsigned)
 Copied from CRM [id number]. Topic: Clinical - Prescription Issue >> Jan 04, 2024  1:18 PM DeAngela L wrote: Reason for CRM: OneTouch was switched to Accu-Check per the pharmacy cause Medicare was no longer covering OneTouch so they are going to send her a new Accu-Check with supplies and her new prescription has to be for Accu-Check in the future   [facility] - [place], [place] - [address] [place] [address] Phone: [phone] Fax: [phone]  Pt num [phone] BENNIE)

## 2024-01-05 ENCOUNTER — Other Ambulatory Visit: Payer: Self-pay | Admitting: Family Medicine

## 2024-01-07 ENCOUNTER — Other Ambulatory Visit: Payer: Self-pay | Admitting: Family Medicine

## 2024-01-07 MED ORDER — ACCU-CHEK GUIDE W/DEVICE KIT: PACK | 0 refills | Status: AC

## 2024-01-07 MED ORDER — ACCU-CHEK SOFTCLIX LANCETS MISC: 12 refills | Status: AC

## 2024-02-24 ENCOUNTER — Ambulatory Visit: Admitting: Family Medicine

## 2024-02-24 ENCOUNTER — Encounter: Payer: Self-pay | Admitting: Family Medicine

## 2024-02-24 VITALS — BP 120/58 | HR 80 | Temp 97.2°F | Ht 63.0 in | Wt 205.0 lb

## 2024-02-24 DIAGNOSIS — D7589 Other specified diseases of blood and blood-forming organs: Secondary | ICD-10-CM

## 2024-02-24 DIAGNOSIS — I10 Essential (primary) hypertension: Secondary | ICD-10-CM

## 2024-02-24 DIAGNOSIS — E1169 Type 2 diabetes mellitus with other specified complication: Secondary | ICD-10-CM | POA: Diagnosis not present

## 2024-02-24 DIAGNOSIS — E118 Type 2 diabetes mellitus with unspecified complications: Secondary | ICD-10-CM | POA: Diagnosis not present

## 2024-02-24 DIAGNOSIS — I25118 Atherosclerotic heart disease of native coronary artery with other forms of angina pectoris: Secondary | ICD-10-CM

## 2024-02-24 DIAGNOSIS — E785 Hyperlipidemia, unspecified: Secondary | ICD-10-CM

## 2024-02-24 DIAGNOSIS — I4891 Unspecified atrial fibrillation: Secondary | ICD-10-CM

## 2024-02-24 DIAGNOSIS — I5020 Unspecified systolic (congestive) heart failure: Secondary | ICD-10-CM

## 2024-02-24 NOTE — Patient Instructions (Addendum)
Labs today.  Follow up in 6 months.  Take care  Dr. Alyxis Grippi  

## 2024-02-26 LAB — CMP14+EGFR
ALT: 20 IU/L (ref 0–32)
AST: 29 IU/L (ref 0–40)
Albumin: 4.2 g/dL (ref 3.7–4.7)
Alkaline Phosphatase: 89 IU/L (ref 48–129)
BUN/Creatinine Ratio: 29 — ABNORMAL HIGH (ref 12–28)
BUN: 19 mg/dL (ref 8–27)
Bilirubin Total: 0.9 mg/dL (ref 0.0–1.2)
CO2: 29 mmol/L (ref 20–29)
Calcium: 9.6 mg/dL (ref 8.7–10.3)
Chloride: 99 mmol/L (ref 96–106)
Creatinine, Ser: 0.66 mg/dL (ref 0.57–1.00)
Globulin, Total: 2.4 g/dL (ref 1.5–4.5)
Glucose: 194 mg/dL — ABNORMAL HIGH (ref 70–99)
Potassium: 4.1 mmol/L (ref 3.5–5.2)
Sodium: 139 mmol/L (ref 134–144)
Total Protein: 6.6 g/dL (ref 6.0–8.5)
eGFR: 88 mL/min/1.73 (ref >59)

## 2024-02-26 LAB — MICROALBUMIN / CREATININE URINE RATIO
Creatinine, Urine: 87.1 mg/dL
Microalb/Creat Ratio: 32 mg/g{creat} — AB (ref 0–29)
Microalbumin, Urine: 28.2 ug/mL

## 2024-02-26 LAB — LIPID PANEL
Chol/HDL Ratio: 2.3 ratio (ref 0.0–4.4)
Cholesterol, Total: 153 mg/dL (ref 100–199)
HDL: 67 mg/dL (ref >39)
LDL Chol Calc (NIH): 65 mg/dL (ref 0–99)
Triglycerides: 118 mg/dL (ref 0–149)
VLDL Cholesterol Cal: 21 mg/dL (ref 5–40)

## 2024-02-26 LAB — CBC
Hematocrit: 44.6 % (ref 34.0–46.6)
Hemoglobin: 14.3 g/dL (ref 11.1–15.9)
MCH: 32.4 pg (ref 26.6–33.0)
MCHC: 32.1 g/dL (ref 31.5–35.7)
MCV: 101 fL — ABNORMAL HIGH (ref 79–97)
Platelets: 160 x10E3/uL (ref 150–450)
RBC: 4.42 x10E6/uL (ref 3.77–5.28)
RDW: 12.6 % (ref 11.7–15.4)
WBC: 4.6 x10E3/uL (ref 3.4–10.8)

## 2024-02-26 LAB — HEMOGLOBIN A1C
Est. average glucose Bld gHb Est-mCnc: 146 mg/dL
Hgb A1c MFr Bld: 6.7 % — ABNORMAL HIGH (ref 4.8–5.6)

## 2024-02-27 ENCOUNTER — Ambulatory Visit: Payer: Self-pay | Admitting: Family Medicine

## 2024-02-27 NOTE — Assessment & Plan Note (Signed)
 Stable

## 2024-02-27 NOTE — Assessment & Plan Note (Signed)
LDL at goal.  Continue Crestor. 

## 2024-02-27 NOTE — Assessment & Plan Note (Signed)
Stable. Euvolemic today. Continue current medications. 

## 2024-02-27 NOTE — Assessment & Plan Note (Signed)
 A1c at goal. Continue Lantus .

## 2024-02-27 NOTE — Assessment & Plan Note (Signed)
 Stable.  Continue current medications.

## 2024-02-27 NOTE — Progress Notes (Signed)
 Subjective:  Patient ID: Sue Andrews, female    DOB: [birth year]  Age: 83 y.o. MRN: [id number]  CC:   Chief Complaint  Patient presents with   6 month follow up     Pt asks about if it is ok for her to take the covid vaccine Also has been taking furosemide  for 30 yrs is this safe    HPI:  83 year old female presents for follow up.  Overall doing well.  No chest pain. Follows with cardiology regarding CAD, HFrEF, Atrial fibrillation. Currently stable.   BP well controlled.   LDL has been at goal.   Needs labs today.  DM-2 has been stable on Lantus . Needs A1c today.  Patient Active Problem List   Diagnosis Date Noted   Controlled diabetes mellitus type 2 with complications (HCC) 01/03/2024   Peripheral artery disease 08/24/2023   Balance problem 11/24/2022   Essential hypertension 05/27/2022   Atrial fibrillation (HCC) 09/24/2021   HFrEF (heart failure with reduced ejection fraction) (HCC) 09/24/2021   Hyperlipidemia associated with type 2 diabetes mellitus (HCC) 09/24/2021   Coronary artery disease of native artery of native heart with stable angina pectoris 09/24/2021    Social Hx   Social History   Socioeconomic History   Marital status: Widowed    Spouse name: Not on file   Number of children: 2   Years of education: Not on file   Highest education level: Not on file  Occupational History   Not on file  Tobacco Use   Smoking status: Never   Smokeless tobacco: Never  Vaping Use   Vaping status: Never Used  Substance and Sexual Activity   Alcohol use: Not Currently   Drug use: Never   Sexual activity: Not on file  Other Topics Concern   Not on file  Social History Narrative   Lives with son   Social Drivers of Health   Financial Resource Strain: Low Risk  (04/30/2023)   Overall Financial Resource Strain (CARDIA)    Difficulty of Paying Living Expenses: Not hard at all  Food Insecurity: No Food Insecurity (04/30/2023)   Hunger Vital Sign     Worried About Running Out of Food in the Last Year: Never true    Ran Out of Food in the Last Year: Never true  Transportation Needs: No Transportation Needs (04/30/2023)   PRAPARE - Administrator, Civil Service (Medical): No    Lack of Transportation (Non-Medical): No  Physical Activity: Sufficiently Active (04/30/2023)   Exercise Vital Sign    Days of Exercise per Week: 7 days    Minutes of Exercise per Session: 30 min  Stress: No Stress Concern Present (04/30/2023)   [employer] of Occupational Health - Occupational Stress Questionnaire    Feeling of Stress : Not at all  Social Connections: Moderately Isolated (04/30/2023)   Social Connection and Isolation Panel    Frequency of Communication with Friends and Family: More than three times a week    Frequency of Social Gatherings with Friends and Family: More than three times a week    Attends Religious Services: More than 4 times per year    Active Member of [employer] or Organizations: No    Attends Banker Meetings: Never    Marital Status: Widowed    Review of Systems Per HPI  Objective:  BP (!) 120/58   Pulse 80   Temp (!) 97.2 F (36.2 C)   Ht 5' 3 (1.6 m)  Wt 205 lb (93 kg)   SpO2 96%   BMI 36.31 kg/m      02/24/2024    1:07 PM 12/29/2023    1:52 PM 08/24/2023    1:03 PM  BP/Weight  Systolic BP 120 136 136  Diastolic BP 58 72 83  Wt. (Lbs) 205 205 205  BMI 36.31 kg/m2 36.31 kg/m2 36.31 kg/m2    Physical Exam Vitals and nursing note reviewed.  Constitutional:      General: She is not in acute distress.    Appearance: Normal appearance.  HENT:     Head: Normocephalic and atraumatic.  Eyes:     General:        Right eye: No discharge.        Left eye: No discharge.     Conjunctiva/sclera: Conjunctivae normal.  Cardiovascular:     Comments: Irregularly Irregular. Pulmonary:     Effort: Pulmonary effort is normal.     Breath sounds: Normal breath sounds. No wheezing, rhonchi or  rales.  Neurological:     Mental Status: She is alert.  Psychiatric:        Mood and Affect: Mood normal.        Behavior: Behavior normal.     Lab Results  Component Value Date   WBC 4.6 02/24/2024   HGB 14.3 02/24/2024   HCT 44.6 02/24/2024   PLT 160 02/24/2024   GLUCOSE 194 (H) 02/24/2024   CHOL 153 02/24/2024   TRIG 118 02/24/2024   HDL 67 02/24/2024   LDLCALC 65 02/24/2024   ALT 20 02/24/2024   AST 29 02/24/2024   NA 139 02/24/2024   K 4.1 02/24/2024   CL 99 02/24/2024   CREATININE 0.66 02/24/2024   BUN 19 02/24/2024   CO2 29 02/24/2024   TSH 2.000 08/24/2023   INR 1.2 04/18/2021   HGBA1C 6.7 (H) 02/24/2024     Assessment & Plan:  Essential hypertension Assessment & Plan: Stable. Continue current medications.   Hyperlipidemia associated with type 2 diabetes mellitus (HCC) Assessment & Plan: LDL at goal. Continue Crestor .  Orders: -     Lipid panel  Controlled diabetes mellitus type 2 with complications (HCC) Assessment & Plan: A1c at goal. Continue Lantus .  Orders: -     Hemoglobin A1c -     Microalbumin / creatinine urine ratio -     CMP14+EGFR  Macrocytosis -     CBC  HFrEF (heart failure with reduced ejection fraction) (HCC) Assessment & Plan: Stable. Euvolemic today. Continue current medications.   Coronary artery disease of native artery of native heart with stable angina pectoris Assessment & Plan: Stable.   Atrial fibrillation, unspecified type [facility]) Assessment & Plan: Stable.     Follow-up:  6 months.  Jacqulyn Ahle DO [facility]

## 2024-02-28 ENCOUNTER — Other Ambulatory Visit: Payer: Self-pay | Admitting: Family Medicine

## 2024-02-28 MED ORDER — DAPAGLIFLOZIN PROPANEDIOL 5 MG PO TABS: 5.0000 mg | ORAL_TABLET | Freq: Every day | ORAL | 3 refills | Status: AC

## 2024-04-05 ENCOUNTER — Other Ambulatory Visit: Payer: Self-pay | Admitting: Family Medicine

## 2024-05-01 ENCOUNTER — Telehealth: Payer: Self-pay

## 2024-05-01 NOTE — Telephone Encounter (Signed)
 Error

## 2024-05-05 ENCOUNTER — Ambulatory Visit: Payer: Medicare Other

## 2024-05-07 ENCOUNTER — Other Ambulatory Visit: Payer: Self-pay | Admitting: Family Medicine

## 2024-05-07 MED ORDER — LANTUS SOLOSTAR 100 UNIT/ML ~~LOC~~ SOPN: PEN_INJECTOR | SUBCUTANEOUS | 3 refills | Status: AC

## 2024-06-29 ENCOUNTER — Ambulatory Visit: Admitting: Student

## 2024-06-30 ENCOUNTER — Ambulatory Visit: Admitting: Student

## 2024-08-23 ENCOUNTER — Ambulatory Visit: Admitting: Family Medicine
